# Patient Record
Sex: Female | Born: 1980 | Race: White | Hispanic: No | Marital: Married
Health system: Midwestern US, Community
[De-identification: ages and names within clinical notes are randomized; demographics above are authoritative.]

## PROBLEM LIST (undated history)

## (undated) ENCOUNTER — Inpatient Hospital Stay (HOSPITAL_COMMUNITY): Payer: Self-pay

## (undated) DIAGNOSIS — B379 Candidiasis, unspecified: Secondary | ICD-10-CM

## (undated) DIAGNOSIS — T4145XA Adverse effect of unspecified anesthetic, initial encounter: Secondary | ICD-10-CM

## (undated) DIAGNOSIS — Z789 Other specified health status: Secondary | ICD-10-CM

## (undated) DIAGNOSIS — R42 Dizziness and giddiness: Secondary | ICD-10-CM

## (undated) DIAGNOSIS — Z87440 Personal history of urinary (tract) infections: Secondary | ICD-10-CM

## (undated) DIAGNOSIS — R51 Headache: Secondary | ICD-10-CM

## (undated) DIAGNOSIS — K429 Umbilical hernia without obstruction or gangrene: Secondary | ICD-10-CM

## (undated) DIAGNOSIS — K5900 Constipation, unspecified: Secondary | ICD-10-CM

## (undated) DIAGNOSIS — R0602 Shortness of breath: Secondary | ICD-10-CM

## (undated) DIAGNOSIS — T8859XA Other complications of anesthesia, initial encounter: Secondary | ICD-10-CM

## (undated) DIAGNOSIS — K602 Anal fissure, unspecified: Secondary | ICD-10-CM

## (undated) DIAGNOSIS — R040 Epistaxis: Secondary | ICD-10-CM

## (undated) HISTORY — DX: Anal fissure, unspecified: K60.2

## (undated) HISTORY — PX: APPENDECTOMY: SHX54

## (undated) HISTORY — PX: HERNIA REPAIR: SHX51

---

## 2009-08-16 HISTORY — PX: ANAL FISSURE REPAIR: SHX2312

## 2011-11-26 ENCOUNTER — Inpatient Hospital Stay (HOSPITAL_COMMUNITY)
Admission: AD | Admit: 2011-11-26 | Discharge: 2011-11-26 | Disposition: A | Discharge status: Home / Self Care | Payer: Medicaid Other | Source: Ambulatory Visit | Attending: Obstetrics and Gynecology | Admitting: Obstetrics and Gynecology

## 2011-11-26 ENCOUNTER — Encounter (HOSPITAL_COMMUNITY): Payer: Self-pay | Admitting: *Deleted

## 2011-11-26 DIAGNOSIS — O99891 Other specified diseases and conditions complicating pregnancy: Secondary | ICD-10-CM | POA: Insufficient documentation

## 2011-11-26 DIAGNOSIS — R1013 Epigastric pain: Secondary | ICD-10-CM | POA: Insufficient documentation

## 2011-11-26 DIAGNOSIS — O26899 Other specified pregnancy related conditions, unspecified trimester: Secondary | ICD-10-CM

## 2011-11-26 DIAGNOSIS — R12 Heartburn: Secondary | ICD-10-CM | POA: Insufficient documentation

## 2011-11-26 MED ORDER — GI COCKTAIL ~~LOC~~
30.0000 mL | Freq: Once | ORAL | Status: AC
  Administered 2011-11-26: 30 mL via ORAL
  Filled 2011-11-26: qty 30

## 2011-11-26 MED ORDER — RANITIDINE HCL 150 MG PO TABS: 150.0000 mg | ORAL_TABLET | Freq: Two times a day (BID) | ORAL | Status: DC

## 2011-11-26 NOTE — Discharge Instructions (Signed)
Avoid any fried foods or spicy foods. You can chew Tums anytime for this pain. Get your prescription filled and take as directed

## 2011-11-26 NOTE — MAU Provider Note (Signed)
  History     CSN: [id number]  Arrival date and time: 11/26/11 4098   First Provider Initiated Contact with Patient 11/26/11 0159      No chief complaint on file.  HPI Heidi [name] 30 y.o. [redacted]w[redacted]d [facility]  Has had epgastric pain x 3 days and it is getting worse.  Noticing increase in size in upper abdomen.  Has had trouble with constipation and takes oral medicine from [place] to help her BMs be regular.  Had BM today.  Denies any lower abdominal pain.  Denies any vaginal bleeding.  OB History    Grav Para Term Preterm Abortions TAB SAB Ect Mult Living   2         1      No past medical history on file.  No past surgical history on file.  No family history on file.  History  Substance Use Topics  . Smoking status: Not on file  . Smokeless tobacco: Not on file  . Alcohol Use: Not on file    Allergies: No Known Allergies  Prescriptions prior to admission  Medication Sig Dispense Refill  . PRESCRIPTION MEDICATION Take by mouth daily. Pt takes Osmosine for constipation gets it from [place].        ROS Physical Exam   Blood pressure 107/61, pulse 69, temperature 99.1 F (37.3 C), temperature source Oral, resp. rate 18, height 5\' 3"  (1.6 m), weight 185 lb 2 oz (83.972 kg).  Physical Exam  Nursing note and vitals reviewed. Constitutional: She is oriented to person, place, and time. She appears well-developed and well-nourished.  HENT:  Head: Normocephalic.  Eyes: EOM are normal.  Neck: Neck supple.  GI: Soft. There is tenderness. There is no rebound and no guarding.       Tender in upper midline just below sternum.  No lower abdominal tenderness or cramping  Musculoskeletal: Normal range of motion.  Neurological: She is alert and oriented to person, place, and time.  Skin: Skin is warm and dry.  Psychiatric: She has a normal mood and affect.    MAU Course  Procedures  MDM Will give GI  cocktail to see if it helps pain. 0230 - Symptoms improved after GI cocktail.  Assessment and Plan  Heartburn in pregnancy  Plan rx zantac 150 mg PO bid  (#60) no refills Avoid any fried foods or spicy foods. You can chew Tums anytime for this pain. Get your prescription filled and take as directed  [name] 11/26/2011, 2:04 AM

## 2011-11-26 NOTE — MAU Note (Signed)
WENT TO [facility]  ON 3-26-  POSTIVE PREG TEST.   AND EDC.  NEXT APPOINTMENT 4-22.

## 2011-11-26 NOTE — MAU Note (Signed)
PT SAYS STARTED HAVING UPPER ABD PAIN  2 WEEKS AGO- GETTING WORSE. NO VOMITING.  NO LOWER ABD PAIN.  NO PAIN TO VOID.   PT FROM  [place]-  IS TAKING MED FOR CONSTIPATION- WITH RELIEF-- OSMOSINE.

## 2011-12-01 NOTE — MAU Provider Note (Signed)
Agree with above note.  Heidi [name] 12/01/2011 9:40 AM   

## 2011-12-08 ENCOUNTER — Other Ambulatory Visit (HOSPITAL_COMMUNITY): Payer: Self-pay | Admitting: Nurse Practitioner

## 2011-12-08 DIAGNOSIS — Z3689 Encounter for other specified antenatal screening: Secondary | ICD-10-CM

## 2011-12-20 ENCOUNTER — Encounter (HOSPITAL_COMMUNITY): Payer: Self-pay | Admitting: Emergency Medicine

## 2011-12-20 ENCOUNTER — Emergency Department (HOSPITAL_COMMUNITY)
Admission: EM | Admit: 2011-12-20 | Discharge: 2011-12-20 | Disposition: A | Discharge status: Home / Self Care | Payer: Medicaid Other | Source: Home / Self Care | Attending: Emergency Medicine | Admitting: Emergency Medicine

## 2011-12-20 DIAGNOSIS — R0600 Dyspnea, unspecified: Secondary | ICD-10-CM

## 2011-12-20 DIAGNOSIS — R1013 Epigastric pain: Secondary | ICD-10-CM

## 2011-12-20 DIAGNOSIS — R0609 Other forms of dyspnea: Secondary | ICD-10-CM

## 2011-12-20 NOTE — Discharge Instructions (Signed)
  We discuss several things. Your abdominal exam was not concerning at this point in your lung exam was normal we discuss what symptoms should warrant further evaluation. Followup with your doctor Monday as scheduled. Baby sounds good. As discussed try to walk several times a day and try to eat frequently in small amounts. If any worsening pain new symptoms worse discomfort should [facility]     Abdominal Pain During Pregnancy Belly (abdominal) pain is common during pregnancy. Most of the time, it is not a serious problem. Other times, it can be a sign that something is wrong with the pregnancy. Always tell your doctor if you have belly pain. HOME CARE For mild pain:  Do not have sex (intercourse) or put anything in your vagina until you feel better.   Rest until your pain stops. If your pain lasts longer than 1 hour, call your doctor.   Drink clear fluids if you feel sick to your stomach (nauseous).   Do not eat solid food until you feel better.   Only take medicine as told by your doctor.   Keep all doctor visits as told.  GET HELP RIGHT AWAY IF:   You are bleeding, leaking fluid, or pieces of tissue come out of your vagina.   You have more pain or cramping.   You keep throwing up (vomiting).   You have pain when you pee (urinate) or have blood in your pee.   You have a fever.   You do not feel your baby moving as much.   You feel very weak or feel like passing out.   You have trouble breathing, with or without belly pain.   You have a very bad headache and belly pain.   You have fluid leaking from your vagina and belly pain.   You keep having watery poop (diarrhea).   Your belly pain does not go away after resting, or the pain gets worse.  MAKE SURE YOU:   Understand these instructions.   Will watch your condition.   Will get help right away if you are not doing well or get worse.  Document Released: 07/16/2009 Document Revised: 07/17/2011 Document  Reviewed: 02/21/2011 [facility] Patient Information 2012 [place], [place].

## 2011-12-20 NOTE — ED Provider Notes (Signed)
History     CSN: [id number]  Arrival date & time 12/20/11  1517   First MD Initiated Contact with Patient 12/20/11 1522      Chief Complaint  Patient presents with  . Shortness of Breath    (Consider location/radiation/quality/duration/timing/severity/associated sxs/prior treatment) HPI Comments: Patient has been complaining for about a month she has been feeling short of breath and having pains in her stomach especially after she eats. Also describes that she has not been having regular bowel movements. At times she feels bloated. No fevers, no burning or pressure with urination. Patient denies any cough, upper congestion or fevers. She was seen at [facility] hospital the same symptoms as prescribed a medication for her stomach.  Patient denies any vomiting or diarrhea, no headaches.  She is accompanied today by her husband and her first child. They questioned that during first pregnancy she was always expressing pelvic pain not in her upper abdomen as it is now?  Patient is a 31 y.o. female presenting with shortness of breath. The history is provided by the patient.  Shortness of Breath  The current episode started today. The problem occurs continuously. The problem has been unchanged. The problem is moderate. Associated symptoms include shortness of breath. Pertinent negatives include no chest pain, no chest pressure, no orthopnea, no fever, no stridor, no cough and no wheezing.    History reviewed. No pertinent past medical history.  History reviewed. No pertinent past surgical history.  History reviewed. No pertinent family history.  History  Substance Use Topics  . Smoking status: Not on file  . Smokeless tobacco: Not on file  . Alcohol Use: Not on file    OB History    Grav Para Term Preterm Abortions TAB SAB Ect Mult Living   2         1      Review of Systems  Constitutional: Negative for fever, chills, diaphoresis and activity change.  Respiratory: Positive for  shortness of breath. Negative for cough, choking, chest tightness, wheezing and stridor.   Cardiovascular: Negative for chest pain and orthopnea.  Gastrointestinal: Positive for abdominal pain. Negative for vomiting, diarrhea, abdominal distention and rectal pain.  Genitourinary: Negative for dysuria, urgency, vaginal bleeding and pelvic pain.  Skin: Negative for rash.  Neurological: Negative for dizziness, weakness and headaches.    Allergies  Review of patient's allergies indicates no known allergies.  Home Medications   Current Outpatient Rx  Name Route Sig Dispense Refill  . PRESCRIPTION MEDICATION Oral Take by mouth daily. Pt takes Osmosine for constipation gets it from [place].    Marland Kitchen RANITIDINE HCL 150 MG PO TABS Oral Take 1 tablet (150 mg total) by mouth 2 (two) times daily. 60 tablet 1    BP 111/60  Pulse 97  Temp(Src) 97.8 F (36.6 C) (Oral)  Resp 22  SpO2 100%  Physical Exam  Nursing note and vitals reviewed. Constitutional: She appears well-developed and well-nourished. No distress.  HENT:  Head: Normocephalic.  Mouth/Throat: No oropharyngeal exudate.  Eyes: Conjunctivae are normal. No scleral icterus.  Neck: Neck supple.  Pulmonary/Chest: Effort normal and breath sounds normal. No respiratory distress. She has no wheezes.  Abdominal: Soft. She exhibits no distension and no mass. There is no hepatosplenomegaly. There is tenderness in the epigastric area. There is no rigidity, no rebound and no guarding.    Neurological: She is alert.  Skin: No rash noted.    ED Course  Procedures (including critical care time)  Labs Reviewed - No data to display No results found.   1. Epigastric discomfort   2. Dyspnea       MDM  Patient with symptomatology as feeling dyspneic and with epigastric discomfort. Mild epigastric tenderness was elicited during exam. Patient looks comfortable in no respiratory distress. You Fahrenheit as expected for a four-month  pregnancy. Fetal heart rate averaging about 140        Jimmie Molly, MD 12/20/11 1637

## 2011-12-20 NOTE — ED Notes (Signed)
Registration associate JB called nurse station re: pregnant pt c/o shortness of breath and epigastric pressure. Vitals were taken and are WNL at this time.  Pt speaks in full sentences; while mildly tachypnic she does not appear to be in respiratory distress.  Pt directed to return to waiting area to await triage by RN.

## 2011-12-20 NOTE — ED Notes (Signed)
Pt reports having dyspnea after eating for about a month now. She also has bad constipation. No dyspnea at other times.

## 2011-12-22 ENCOUNTER — Ambulatory Visit (HOSPITAL_COMMUNITY)
Admission: RE | Admit: 2011-12-22 | Discharge: 2011-12-22 | Disposition: A | Discharge status: Home / Self Care | Payer: Medicaid Other | Source: Ambulatory Visit | Attending: Nurse Practitioner | Admitting: Nurse Practitioner

## 2011-12-22 ENCOUNTER — Ambulatory Visit (HOSPITAL_COMMUNITY): Payer: Medicaid Other

## 2011-12-22 DIAGNOSIS — O358XX0 Maternal care for other (suspected) fetal abnormality and damage, not applicable or unspecified: Secondary | ICD-10-CM | POA: Insufficient documentation

## 2011-12-22 DIAGNOSIS — Z363 Encounter for antenatal screening for malformations: Secondary | ICD-10-CM | POA: Insufficient documentation

## 2011-12-22 DIAGNOSIS — Z3689 Encounter for other specified antenatal screening: Secondary | ICD-10-CM

## 2011-12-22 DIAGNOSIS — Z1389 Encounter for screening for other disorder: Secondary | ICD-10-CM | POA: Insufficient documentation

## 2012-01-26 ENCOUNTER — Other Ambulatory Visit (HOSPITAL_COMMUNITY): Payer: Self-pay | Admitting: Nurse Practitioner

## 2012-01-26 DIAGNOSIS — O26849 Uterine size-date discrepancy, unspecified trimester: Secondary | ICD-10-CM

## 2012-02-02 ENCOUNTER — Ambulatory Visit (HOSPITAL_COMMUNITY): Payer: Medicaid Other

## 2012-02-05 ENCOUNTER — Ambulatory Visit (HOSPITAL_COMMUNITY)
Admission: RE | Admit: 2012-02-05 | Discharge: 2012-02-05 | Disposition: A | Discharge status: Home / Self Care | Payer: Medicaid Other | Source: Ambulatory Visit | Attending: Nurse Practitioner | Admitting: Nurse Practitioner

## 2012-02-05 DIAGNOSIS — Z3689 Encounter for other specified antenatal screening: Secondary | ICD-10-CM | POA: Insufficient documentation

## 2012-02-05 DIAGNOSIS — O26849 Uterine size-date discrepancy, unspecified trimester: Secondary | ICD-10-CM | POA: Insufficient documentation

## 2012-08-16 ENCOUNTER — Encounter (HOSPITAL_COMMUNITY): Payer: Self-pay | Admitting: *Deleted

## 2012-08-16 ENCOUNTER — Inpatient Hospital Stay (HOSPITAL_COMMUNITY)
Admission: AD | Admit: 2012-08-16 | Discharge: 2012-08-16 | Disposition: A | Discharge status: Home / Self Care | Payer: Self-pay | Source: Ambulatory Visit | Attending: Obstetrics & Gynecology | Admitting: Obstetrics & Gynecology

## 2012-08-16 DIAGNOSIS — M6200 Separation of muscle (nontraumatic), unspecified site: Secondary | ICD-10-CM

## 2012-08-16 DIAGNOSIS — R109 Unspecified abdominal pain: Secondary | ICD-10-CM | POA: Insufficient documentation

## 2012-08-16 DIAGNOSIS — M6208 Separation of muscle (nontraumatic), other site: Secondary | ICD-10-CM

## 2012-08-16 HISTORY — DX: Other specified health status: Z78.9

## 2012-08-16 LAB — URINALYSIS, ROUTINE W REFLEX MICROSCOPIC
Bilirubin Urine: NEGATIVE
Ketones, ur: 15 mg/dL — AB
Nitrite: NEGATIVE
pH: 7 (ref 5.0–8.0)

## 2012-08-16 NOTE — Discharge Instructions (Signed)
Diastasis Recti   Definition: Diastasis recti is a fairly common condition of pregnancy and postpartum in which the right and left halves of Rectus Abdominis muscle spread apart at the body's mid line fascia, the linea alba.  Widening and thinning of the mid line tissue occurs in response the force of the uterus pushing against the abdominal wall, in conjunction with pregnancy hormones that soften connective tissue. A mid line of more than 2 to 2.5 finger-widths, or 2 centimeters, is considered problematic. Diastasis recti can occur anytime in the last half of pregnancy but is most commonly seen after pregnancy when the abdominal wall is lax and the thinner mid line tissue no longer provides adequate support for the torso and internal organs.  A small amount of widening of the mid line happens in all pregnancies and is normal. Diastasis recti occurs in about 30% of all pregnancies. Some postpartum women's mid lines close to less than 2 finger-widths spontaneously, but for many, the tissue remains too wide, causing problems.  Diastasis recti can also be seen in infants and adults with excessive abdominal visceral fat.    Diastatsis recti reduces the integrity and functional strength of the abdominal wall and can aggravate lower back pain and pelvic instability. Separation in a previous pregnancy significantly increases the probability, and severity, of the condition in subsequent pregnancies. Women expecting more than one baby, petite women, those with a pronounced sway back, or with poor abdominal muscle tone are at greatest risk. Genetics also plays a big role. For some women, it simply how their bodies respond to pregnancy.  Unfortunately, flurries of misconception swirl around diastasis recti and abdominal exercise during and after pregnancy in general. You're likely to encounter a broad range of contradictory opinions and advice about how to recondition your abdominal wall and how to restore the  midline after childbirth. Some of these assertions can cause unnecessary alarm, while another common piece of advice-do a lot of "crunches"-can actually worsen diastasis recti/abdominal separation.  COMMON MYTHS about Diastasis Recti/Abdominal Separation and Postpartum Abdominal Reconditioning:  Diastasis recti/abdominal separation causes permanent damage to your abdomen.  Diastasis recti/abdominal separation requires surgical repair.  Diastasis recti/abdominal separation causes permanent bulging of the abdomen, i.e., "mummy-tummy."  Diastasis recti causes pain.  The abdominal muscles will always be weaker after childbirth.  All women should wait for at least six weeks after delivery before beginning any abdominal exercises or postnatal reconditioning program.  None of these statements are true!  Diastasis Recti/Abdominal Separation Test This simple self-test will help you determine if you have diastasis recti.  1. Lie on your back with your knees bent, and the soles of your feet on the floor.  2. Place one hand behind your head, and the other hand on your abdomen, with your fingertips across your midline-parallel with your waistline- at the level of your belly button.  3. With your abdominal wall relaxed, gently press your fingertips into your abdomen.  4. Roll your upper body off the floor into a "crunch," making sure that your ribcage moves closer to your pelvis.  5. Move your fingertips back and forth across your midline, feeling for the right and left sides of your rectus abdominis muscle. Test for separation at, above, and below your belly button.  Use Correct Form for an Accurate Assessment Make sure that you don't simply pull your head off the mat-a common mistake. To effectively contract your abs, you need to move your ribcage closer to you pelvis. If you don't  adequately activate your abdominal wall, you might assume that you have abdominal separation. But for most, as the rib cage moves  closer to the pelvis and the contraction deepens, the width of the gap at your midline will decrease.  Don't panic if you feel a "hole" in your belly in the first few postpartum weeks. Everyone's connective tissue at the midline is lax after childbirth. As you recover, your midline will slowly regain its former density and elasticity, and the "hole" will become shallower, and if you do the right exercises, more narrow too.  Signs of Diastasis Recti/Abdominal Separation A gap of more than 2 1/2 finger-widths when the rectus abdominis is fully contracted.  The gap does not shrink as you contract your abdominal wall.  You can see a small mound protruding along the length of you midline.  NOTE: If at any time you see a round, hard, or painful bulge protruding from your belly button area, or along your mid line, consult with your OBGYN.  Special Precautions for Women with Diastasis Recti/Abdominal Separation Avoid all activities that place stress on the midline, that stretch or overly expand the abdominal wall through everyday activities, exercise, or breathing techniques.  Some Types of Movement to Avoid  Movements where the upper body twists and the arm on that side extends away from the body, such as "triangle pose."  Exercises that require lying backward over a large exercise ball.  Yoga postures that stretch the abs, such as "cow pose," "up-dog," all backbends, and "belly breathing."  Abdominal exercises that flex the upper spine off the floor or against the force of gravity such as: as crunches, oblique curls, "bicycles," roll ups/roll downs, etc.  Pilates mat and reformer exercises that utilize the "head float" position, upper body flexion, or double leg extension.  Any exercise that causes your abdominal wall to bulge out upon exertion.  Lifting and carrying very heavy objects.  Quadruped exercises without adequate abdominal support.  Intense coughing without abdominal support.  During  pregnancy or after childbirth, if you develop a cough from allergies or a respiratory illness, such as a cold or flu, place your hands across your belly and manually splint your abdomen together during coughing episodes. This will provide needed additional support, and help to prevent separation of your midline.  To protect your mid line during pregnancy, always use the "log roll" maneuver when rising from the floor or out of bed. Log roll: with your torso and head aligned and in one piece, roll over onto your side, then use your arms to help push yourself up to a sitting position.   Exercises to Prevent or Lessen the Severity of Diastasis Recti/Abdominal Separation You can do a lot to help prevent or lessen the severity of diastasis recti by strengthening your deepest abdominal muscle, your Transverse Abdominis, or TvA. The TvA is the body's internal "girdle" and when contracted, compresses the abdominal wall.  All pregnant women should perform basic TvA exercises throughout their pregnancies to help prevent diastasis recti as well as back pain and pelvic instability. As an added bonus, because the TvA is our body's major expulsion muscle, building and maintaining strength here greatly aids in the pushing phase of labor.    Diastasis Recti/Abdominal Separation Recovery and Rehabilitation Most postnatal fitness programs feature standard abdominal exercises like crunches and curls, and advise women with diastasis recti to manually splint their midlines together with either the hands or a towel wrapped around the belly while performing these exercises.  However, reliance on external forces only will not adequately close the gap in your midline.  Additionally, prenatal and postpartum exercises that use lower spine flexion rather than upper spine flexion provide many more benefits and should be used whenever possible. Exercises that flex the upper spine should be done sparingly, if at all during and after  pregnancy.  External splinting (either with the hands, a towel, or belly binder) of the rectus abdominis without first having established adequate strength and functional control in your TvA will not repair diastasis recti. External splinting performs the function that your TvA should be doing, therefore the muscle does not engage and stays weak with poor functioning. When the TvA lacks adequate strength and functional control, then the abdominal wall bulges out upon exertion, which stretches the midline and can make abdominal separation worse. (Please see Abdominal Reconditioning After Pregnancy for more information on this topic.)  After pregnancy, abdominal splinting with the hands or other devices is an acceptable technique only after the TvA has been thoroughly reconditioned.  Another Common but Ineffective Postpartum Exercise: "Mini-Crunches" Other postpartum exercise programs suggest that women with diastasis recti start abdominal reconditioning with "mini-crunches," or lifting only the head, while applying manual compression with the hands across the abdomen. Isolation of the head exerts a small amount of work on the external abdominal muscles, but only in some individuals. Women with good neuromuscular coordination will, for the most part, engage the muscles that flex the neck in this movement, not their abs.  In addition, lifting the head off the floor as a preamble to exercises like crunches teaches poor technique. Ideally, in all abdominal exercises that flex the upper spine, movement should be initiated in the thorax, which will pull the rib cage closer to the pelvis. The head and shoulders should stay fairly relaxed, and basically "go along for the ride." In Pilate's exercises, this is aptly called the "head-float position." (For more information see Abdominal Reconditioning After Pregnancy)  Signs of Midline Recovery Abdominal separation resolves when either your muscles have pulled back  together to less than two finger widths, or when you can feel that your midline has become strong and elastic, at about six to nine months postpartum. At this point, you will no longer feel a hole in your abdomen. Once your connective tissue has regained its former density and elasticity, you are no longer at risk for hernia or other associated problems.

## 2012-08-16 NOTE — MAU Provider Note (Signed)
Chief Complaint: Abdominal Pain   First Provider Initiated Contact with Patient 08/16/12 2030     SUBJECTIVE HPI: Heidi [name] is a 32 y.o. G2P2002 post LTCS on 05/16/12 in [place] who presents to maternity admissions reporting upper abdominal pain when she lifts her baby or uses her abdominal muscles.  The pain is not affected by eating and she is having regular bowel movements and no urinary difficulty.  She denies vaginal bleeding, vaginal itching/burning, urinary symptoms, h/a, dizziness, n/v, or fever/chills.     Past Medical History  Diagnosis Date  . No pertinent past medical history    Past Surgical History  Procedure Date  . Cesarean section   . Anal fissure repair    History   Social History  . Marital Status: Married    Spouse Name: N/A    Number of Children: N/A  . Years of Education: N/A   Occupational History  . Not on file.   Social History Main Topics  . Smoking status: Never Smoker   . Smokeless tobacco: Not on file  . Alcohol Use: No  . Drug Use: No  . Sexually Active: Yes    Birth Control/ Protection: Pill   Other Topics Concern  . Not on file   Social History Narrative  . No narrative on file   No current facility-administered medications on file prior to encounter.   No current outpatient prescriptions on file prior to encounter.   No Known Allergies  ROS: Pertinent items in HPI  OBJECTIVE Blood pressure 105/83, pulse 74, temperature 97.9 F (36.6 C), temperature source Oral, height 5\' 2"  (1.575 m), weight 86.546 kg (190 lb 12.8 oz), last menstrual period 06/25/2012, currently breastfeeding. GENERAL: Well-developed, well-nourished female in no acute distress.  HEENT: Normocephalic HEART: normal rate RESP: normal effort ABDOMEN: Soft, non-tender, no hernia, no mass palpable, large diastasis recti (4-5cm) with large bulge when pt sits up/performs crunch EXTREMITIES: Nontender, no edema NEURO: Alert and oriented SPECULUM EXAM:  Deferred  LAB RESULTS Results for orders placed during the hospital encounter of 08/16/12 (from the past 24 hour(s))  URINALYSIS, ROUTINE W REFLEX MICROSCOPIC     Status: Abnormal   Collection Time   08/16/12  7:35 PM      Component Value Range   Color, Urine YELLOW  YELLOW   APPearance CLEAR  CLEAR   Specific Gravity, Urine 1.010  1.005 - 1.030   pH 7.0  5.0 - 8.0   Glucose, UA NEGATIVE  NEGATIVE mg/dL   Hgb urine dipstick NEGATIVE  NEGATIVE   Bilirubin Urine NEGATIVE  NEGATIVE   Ketones, ur 15 (*) NEGATIVE mg/dL   Protein, ur NEGATIVE  NEGATIVE mg/dL   Urobilinogen, UA 0.2  0.0 - 1.0 mg/dL   Nitrite NEGATIVE  NEGATIVE   Leukocytes, UA NEGATIVE  NEGATIVE  POCT PREGNANCY, URINE     Status: Normal   Collection Time   08/16/12  8:03 PM      Component Value Range   Preg Test, Ur NEGATIVE  NEGATIVE    ASSESSMENT 1. Diastasis recti     PLAN Discharge home Ambulatory referral for PT Suggested exercises given, encouraged good body mechanics Return to MAU as needed       Follow-up Information    Please follow up.   Contact information:   [facility] [address], [address] [place], [place] [address]    Main: [phone]  Fax: [phone]  Sharen Counter Certified Nurse-Midwife 08/16/2012  9:13 PM

## 2012-08-16 NOTE — MAU Note (Signed)
Pt post C/S 05/15/2012, in [place].  Pt presents tonight feels like she has been bloated since surgery and heavy "like a bowling ball is in her belly."  Denies bleeding or discharge.  LMP 06/25/2012, bleed for one day in December.  Taking birth control pills from [place].

## 2012-10-16 ENCOUNTER — Emergency Department (HOSPITAL_COMMUNITY): Payer: Self-pay

## 2012-10-16 ENCOUNTER — Emergency Department (HOSPITAL_COMMUNITY)
Admission: EM | Admit: 2012-10-16 | Discharge: 2012-10-16 | Disposition: A | Discharge status: Home / Self Care | Payer: Self-pay | Attending: Emergency Medicine | Admitting: Emergency Medicine

## 2012-10-16 ENCOUNTER — Encounter (HOSPITAL_COMMUNITY): Payer: Self-pay

## 2012-10-16 DIAGNOSIS — Z3202 Encounter for pregnancy test, result negative: Secondary | ICD-10-CM | POA: Insufficient documentation

## 2012-10-16 DIAGNOSIS — K5900 Constipation, unspecified: Secondary | ICD-10-CM | POA: Insufficient documentation

## 2012-10-16 DIAGNOSIS — K429 Umbilical hernia without obstruction or gangrene: Secondary | ICD-10-CM | POA: Insufficient documentation

## 2012-10-16 DIAGNOSIS — R109 Unspecified abdominal pain: Secondary | ICD-10-CM | POA: Insufficient documentation

## 2012-10-16 LAB — COMPREHENSIVE METABOLIC PANEL
ALT: 15 U/L (ref 0–35)
AST: 17 U/L (ref 0–37)
CO2: 25 mEq/L (ref 19–32)
Chloride: 106 mEq/L (ref 96–112)
GFR calc non Af Amer: >90 mL/min (ref >90)
Sodium: 142 mEq/L (ref 135–145)
Total Bilirubin: 0.3 mg/dL (ref 0.3–1.2)

## 2012-10-16 LAB — CBC WITH DIFFERENTIAL/PLATELET
Basophils Absolute: 0.1 10*3/uL (ref 0.0–0.1)
Lymphocytes Relative: 39 % (ref 12–46)
Neutro Abs: 3.7 10*3/uL (ref 1.7–7.7)
Platelets: 196 10*3/uL (ref 150–400)
RDW: 14 % (ref 11.5–15.5)
WBC: 7.1 10*3/uL (ref 4.0–10.5)

## 2012-10-16 LAB — URINALYSIS, MICROSCOPIC ONLY
Glucose, UA: NEGATIVE mg/dL
Hgb urine dipstick: NEGATIVE
Protein, ur: NEGATIVE mg/dL
Specific Gravity, Urine: 1.016 (ref 1.005–1.030)

## 2012-10-16 MED ORDER — IOHEXOL 300 MG/ML  SOLN
100.0000 mL | Freq: Once | INTRAMUSCULAR | Status: AC | PRN
  Administered 2012-10-16: 100 mL via INTRAVENOUS

## 2012-10-16 MED ORDER — DOCUSATE SODIUM 100 MG PO CAPS: 100.0000 mg | ORAL_CAPSULE | Freq: Two times a day (BID) | ORAL | Status: DC

## 2012-10-16 MED ORDER — IOHEXOL 300 MG/ML  SOLN
50.0000 mL | Freq: Once | INTRAMUSCULAR | Status: AC | PRN
  Administered 2012-10-16: 50 mL via ORAL

## 2012-10-16 MED ORDER — SODIUM CHLORIDE 0.9 % IV BOLUS (SEPSIS)
1000.0000 mL | Freq: Once | INTRAVENOUS | Status: AC
  Administered 2012-10-16: 1000 mL via INTRAVENOUS

## 2012-10-16 MED ORDER — OXYCODONE-ACETAMINOPHEN 5-325 MG PO TABS: 2.0000 | ORAL_TABLET | ORAL | Status: DC | PRN

## 2012-10-16 NOTE — ED Notes (Addendum)
Pt has hernia in epigastric region pt has had a c-section 5 mos ago. Pt states eating and holding her baby makes the pain worse. Pt states she is in 8/10 pain. Pt speaks limited english. Husband at bedside to translate.

## 2012-10-16 NOTE — ED Notes (Signed)
Discharge instructions reviewed. Pt verbalized understanding.  

## 2012-10-16 NOTE — ED Notes (Signed)
CT notified pt finished with first cup of contrast.  

## 2012-10-16 NOTE — ED Notes (Signed)
Pt transported to CT ?

## 2012-10-16 NOTE — ED Provider Notes (Signed)
History     CSN: [id number]  Arrival date & time 10/16/12  1201   First MD Initiated Contact with Patient 10/16/12 1504      Chief Complaint  Patient presents with  . Hernia  . Abdominal Pain    (Consider location/radiation/quality/duration/timing/severity/associated sxs/prior treatment) HPI Comments: Patient presents with right-sided abdominal pain. She states this has been going on for about the last 4-5 months. She feels that it is associated with a prior hernia on the right side. She states that she developed a hernia after her pregnancy. She's had 2 past C-sections. Her last C-section was 5 months ago and her pain has intensified over the last 5 months since his prior surgery. She denies any nausea or vomiting. She denies any fevers or chills. She states the pain is worse when she strains or when her abdomen is full after she eats. She [place] country [place] about the last 3-4 months. She has not seen a doctor about this hernia. She does have a history of constipation and her last bowel movement was 3 days ago. However she does see that she's passing gas. She denies any urinary symptoms.she does state that she feels a knot in the right side of her abdomen and previously when she laid flat she could taken not to go way. However she says that the knot is been protruding for the last 5 months. She states the pain has been worse over the last 3 days.  Patient is a 32 y.o. female presenting with abdominal pain.  Abdominal Pain Associated symptoms: constipation   Associated symptoms: no chest pain, no chills, no cough, no diarrhea, no fatigue, no fever, no hematuria, no nausea, no shortness of breath and no vomiting     Past Medical History  Diagnosis Date  . No pertinent past medical history     Past Surgical History  Procedure Laterality Date  . Cesarean section    . Anal fissure repair      No family history on  file.  History  Substance Use Topics  . Smoking status: Never Smoker   . Smokeless tobacco: Not on file  . Alcohol Use: No    OB History   Grav Para Term Preterm Abortions TAB SAB Ect Mult Living   2 2 2       2       Review of Systems  Constitutional: Negative for fever, chills, diaphoresis and fatigue.  HENT: Negative for congestion, rhinorrhea and sneezing.   Eyes: Negative.   Respiratory: Negative for cough, chest tightness and shortness of breath.   Cardiovascular: Negative for chest pain and leg swelling.  Gastrointestinal: Positive for abdominal pain and constipation. Negative for nausea, vomiting, diarrhea and blood in stool.  Genitourinary: Negative for frequency, hematuria, flank pain and difficulty urinating.  Musculoskeletal: Negative for back pain and arthralgias.  Skin: Negative for rash.  Neurological: Negative for dizziness, speech difficulty, weakness, numbness and headaches.    Allergies  Review of patient's allergies indicates no known allergies.  Home Medications   Current Outpatient Rx  Name  Route  Sig  Dispense  Refill  . docusate sodium (COLACE) 100 MG capsule   Oral   Take 1 capsule (100 mg total) by mouth every 12 (twelve) hours.   60 capsule   0   . oxyCODONE-acetaminophen (PERCOCET) 5-325 MG per tablet   Oral   Take 2 tablets by mouth every 4 (  four) hours as needed for pain.   20 tablet   0     BP 123/62  Temp(Src) 97.7 F (36.5 C) (Oral)  Resp 17  SpO2 97%  Breastfeeding? Yes  Physical Exam  Constitutional: She is oriented to person, place, and time. She appears well-developed and well-nourished.  HENT:  Head: Normocephalic and atraumatic.  Eyes: Pupils are equal, round, and reactive to light.  Neck: Normal range of motion. Neck supple.  Cardiovascular: Normal rate, regular rhythm and normal heart sounds.   Pulmonary/Chest: Effort normal and breath sounds normal. No respiratory distress. She has no wheezes. She has no rales.  She exhibits no tenderness.  Abdominal: Soft. Bowel sounds are normal. There is tenderness (Positive moderate tenderness to the right midabdomen. I am unable to palpate a specific hernia.). There is no rebound and no guarding.  Musculoskeletal: Normal range of motion. She exhibits no edema.  Lymphadenopathy:    She has no cervical adenopathy.  Neurological: She is alert and oriented to person, place, and time.  Skin: Skin is warm and dry. No rash noted.  Psychiatric: She has a normal mood and affect.    ED Course  Procedures (including critical care time)  Results for orders placed during the hospital encounter of 10/16/12  URINALYSIS, MICROSCOPIC ONLY      Result Value Range   Color, Urine YELLOW  YELLOW   APPearance CLEAR  CLEAR   Specific Gravity, Urine 1.016  1.005 - 1.030   pH 6.5  5.0 - 8.0   Glucose, UA NEGATIVE  NEGATIVE mg/dL   Hgb urine dipstick NEGATIVE  NEGATIVE   Bilirubin Urine NEGATIVE  NEGATIVE   Ketones, ur NEGATIVE  NEGATIVE mg/dL   Protein, ur NEGATIVE  NEGATIVE mg/dL   Urobilinogen, UA 0.2  0.0 - 1.0 mg/dL   Nitrite NEGATIVE  NEGATIVE   Leukocytes, UA NEGATIVE  NEGATIVE   WBC, UA 0-2  <3 WBC/hpf   RBC / HPF 0-2  <3 RBC/hpf   Bacteria, UA RARE  RARE   Squamous Epithelial / LPF FEW (*) RARE  CBC WITH DIFFERENTIAL      Result Value Range   WBC 7.1  4.0 - 10.5 K/uL   RBC 5.24 (*) 3.87 - 5.11 MIL/uL   Hemoglobin 14.6  12.0 - 15.0 g/dL   HCT 40.9  81.1 - 91.4 %   MCV 82.8  78.0 - 100.0 fL   MCH 27.9  26.0 - 34.0 pg   MCHC 33.6  30.0 - 36.0 g/dL   RDW 78.2  95.6 - 21.3 %   Platelets 196  150 - 400 K/uL   Neutrophils Relative 52  43 - 77 %   Neutro Abs 3.7  1.7 - 7.7 K/uL   Lymphocytes Relative 39  12 - 46 %   Lymphs Abs 2.8  0.7 - 4.0 K/uL   Monocytes Relative 7  3 - 12 %   Monocytes Absolute 0.5  0.1 - 1.0 K/uL   Eosinophils Relative 1  0 - 5 %   Eosinophils Absolute 0.1  0.0 - 0.7 K/uL   Basophils Relative 1  0 - 1 %   Basophils Absolute 0.1  0.0 -  0.1 K/uL  COMPREHENSIVE METABOLIC PANEL      Result Value Range   Sodium 142  135 - 145 mEq/L   Potassium 3.9  3.5 - 5.1 mEq/L   Chloride 106  96 - 112 mEq/L   CO2 25  19 - 32 mEq/L  Glucose, Bld 113 (*) 70 - 99 mg/dL   BUN 10  6 - 23 mg/dL   Creatinine, Ser 3.53 (*) 0.50 - 1.10 mg/dL   Calcium 9.6  8.4 - 61.4 mg/dL   Total Protein 7.3  6.0 - 8.3 g/dL   Albumin 3.6  3.5 - 5.2 g/dL   AST 17  0 - 37 U/L   ALT 15  0 - 35 U/L   Alkaline Phosphatase 116  39 - 117 U/L   Total Bilirubin 0.3  0.3 - 1.2 mg/dL   GFR calc non Af Amer >90  >90 mL/min   GFR calc Af Amer >90  >90 mL/min  LIPASE, BLOOD      Result Value Range   Lipase 31  11 - 59 U/L  POCT PREGNANCY, URINE      Result Value Range   Preg Test, Ur NEGATIVE  NEGATIVE   Ct Abdomen Pelvis W Contrast  10/16/2012  *RADIOLOGY REPORT*  Clinical Data: History of hernia and epigastric region.  Right lower quadrant pain.  CT ABDOMEN AND PELVIS WITH CONTRAST  Technique:  Multidetector CT imaging of the abdomen and pelvis was performed following the standard protocol during bolus administration of intravenous contrast.  Contrast: OMNIPAQUE IOHEXOL 300 MG/ML  SOLN  Comparison: None.  Findings: Mild dependent atelectasis is seen in the lung bases. No pleural or pericardial effusion.  The gallbladder, liver, spleen, adrenal glands, pancreas and kidneys are unremarkable.  There is laxity of the anterior abdominal wall with a small hernia at the umbilicus identified. Small and large bowel loops extends into the lax anterior abdominal wall and hernia but there is no evidence of bowel obstruction, incarceration or ischemia.  The appendix is visualized and normal in appearance.  Fairly large stool burden through the colon is noted.  Small bowel is unremarkable.  IUD is in place the uterus. Urinary bladder appears normal.  No focal bony abnormality.  IMPRESSION:  1.  Negative for appendicitis.  No acute abnormality. 2.  Laxity of the anterior abdominal  wall with a small ventral hernia at the umbilicus.  Negative for evidence of bowel obstruction, incarceration or other complicating feature.   Original Report Authenticated By: Holley Dexter, M.D.        1. Umbilical hernia       MDM  Pt with no evidence of incarcerated hernia.  No vomiting.  Will start pain meds, colace, refer to f/u with surgery.  Advised to return if symptoms worsen.        Rolan Bucco, MD 10/16/12 1729

## 2012-10-16 NOTE — ED Notes (Signed)
Pt developed hernia when she was pregnant approximately 5 months ago.  Pt presents today for evaluation of hernia.  Pt also c/o abdominal pain to the RLQ.  Pt has had 2 c-sections.

## 2012-10-20 ENCOUNTER — Ambulatory Visit (INDEPENDENT_AMBULATORY_CARE_PROVIDER_SITE_OTHER): Payer: Self-pay | Admitting: Surgery

## 2012-10-25 ENCOUNTER — Ambulatory Visit (INDEPENDENT_AMBULATORY_CARE_PROVIDER_SITE_OTHER): Payer: Self-pay | Admitting: Surgery

## 2012-10-28 ENCOUNTER — Encounter (HOSPITAL_COMMUNITY): Payer: Self-pay | Admitting: Emergency Medicine

## 2012-10-28 ENCOUNTER — Emergency Department (HOSPITAL_COMMUNITY)
Admission: EM | Admit: 2012-10-28 | Discharge: 2012-10-29 | Disposition: A | Discharge status: Home / Self Care | Payer: Self-pay | Attending: Emergency Medicine | Admitting: Emergency Medicine

## 2012-10-28 DIAGNOSIS — K5900 Constipation, unspecified: Secondary | ICD-10-CM | POA: Insufficient documentation

## 2012-10-28 DIAGNOSIS — Z8719 Personal history of other diseases of the digestive system: Secondary | ICD-10-CM | POA: Insufficient documentation

## 2012-10-28 DIAGNOSIS — Z3202 Encounter for pregnancy test, result negative: Secondary | ICD-10-CM | POA: Insufficient documentation

## 2012-10-28 DIAGNOSIS — K439 Ventral hernia without obstruction or gangrene: Secondary | ICD-10-CM | POA: Insufficient documentation

## 2012-10-28 DIAGNOSIS — Z79899 Other long term (current) drug therapy: Secondary | ICD-10-CM | POA: Insufficient documentation

## 2012-10-28 HISTORY — DX: Umbilical hernia without obstruction or gangrene: K42.9

## 2012-10-28 LAB — COMPREHENSIVE METABOLIC PANEL
Alkaline Phosphatase: 119 U/L — ABNORMAL HIGH (ref 39–117)
BUN: 13 mg/dL (ref 6–23)
CO2: 25 mEq/L (ref 19–32)
Chloride: 106 mEq/L (ref 96–112)
Creatinine, Ser: 0.53 mg/dL (ref 0.50–1.10)
GFR calc Af Amer: >90 mL/min (ref >90)
GFR calc non Af Amer: >90 mL/min (ref >90)
Glucose, Bld: 106 mg/dL — ABNORMAL HIGH (ref 70–99)
Total Bilirubin: 0.2 mg/dL — ABNORMAL LOW (ref 0.3–1.2)

## 2012-10-28 LAB — CBC WITH DIFFERENTIAL/PLATELET
Basophils Relative: 1 % (ref 0–1)
HCT: 42.3 % (ref 36.0–46.0)
Hemoglobin: 14.2 g/dL (ref 12.0–15.0)
Lymphocytes Relative: 44 % (ref 12–46)
Lymphs Abs: 3.3 10*3/uL (ref 0.7–4.0)
MCHC: 33.6 g/dL (ref 30.0–36.0)
Monocytes Absolute: 0.5 10*3/uL (ref 0.1–1.0)
Monocytes Relative: 7 % (ref 3–12)
Neutro Abs: 3.6 10*3/uL (ref 1.7–7.7)
RBC: 5.07 MIL/uL (ref 3.87–5.11)

## 2012-10-28 NOTE — ED Notes (Signed)
PT. REPORTS PERSISTENT MID/RIGHT ABDOMINAL PAIN FOR SEVERAL DAYS , DENIES NAUSEA OR DIAPHORESIS , SEEN HERE LAST 10/16/2012 DIAGNOSED WITH UMBILICAL HERNIA REFERRED TO DR. BYERLY , [facility]  BUT WAS NOT ABLE TO ARRANGE APPOINTMENT DUE TO FINANCIAL CONSTRAINTS. NO FEVER OR CHILLS.

## 2012-10-29 ENCOUNTER — Emergency Department (HOSPITAL_COMMUNITY): Payer: Self-pay

## 2012-10-29 LAB — URINALYSIS, ROUTINE W REFLEX MICROSCOPIC
Bilirubin Urine: NEGATIVE
Glucose, UA: NEGATIVE mg/dL
Ketones, ur: NEGATIVE mg/dL
Leukocytes, UA: NEGATIVE
pH: 5.5 (ref 5.0–8.0)

## 2012-10-29 LAB — POCT PREGNANCY, URINE: Preg Test, Ur: NEGATIVE

## 2012-10-29 MED ORDER — POLYETHYLENE GLYCOL 3350 17 G PO PACK: 17.0000 g | PACK | Freq: Every day | ORAL | Status: DC

## 2012-10-29 NOTE — ED Provider Notes (Signed)
Medical screening examination/treatment/procedure(s) were performed by non-physician practitioner and as supervising physician I was immediately available for consultation/collaboration.  Josie Burleigh M Ronn Smolinsky, MD 10/29/12 0749 

## 2012-10-29 NOTE — ED Provider Notes (Signed)
History     CSN: [id number]  Arrival date & time 10/28/12  1845   First MD Initiated Contact with Patient 10/28/12 2337      Chief Complaint  Patient presents with  . Abdominal Pain    (Consider location/radiation/quality/duration/timing/severity/associated sxs/prior treatment) Patient is a 32 y.o. [name] presenting with abdominal pain. The history is provided by the patient.  Abdominal Pain Pain location:  RUQ and periumbilical Pain quality: sharp   Pain radiates to:  Does not radiate Associated symptoms comment:  Abdominal pain that is chronic, worse since initial evaluation on March 8th in emergency department. At that time a CT scan done showed a ventral hernia without evidence of incarceration or strangulation. She denies fever, nausea, vomiting. She does report the pain is increased with deep breathing and with eating. They did attempt to contact [facility] as referred but were unable to meet the financial obligation.   Past Medical History  Diagnosis Date  . No pertinent past medical history   . Umbilical hernia     Past Surgical History  Procedure Laterality Date  . Cesarean section    . Anal fissure repair      No family history on file.  History  Substance Use Topics  . Smoking status: Never Smoker   . Smokeless tobacco: Not on file  . Alcohol Use: No    OB History   Grav Para Term Preterm Abortions TAB SAB Ect Mult Living   2 2 2       2       Review of Systems  Gastrointestinal: Positive for abdominal pain.    Allergies  Review of patient's allergies indicates no known allergies.  Home Medications   Current Outpatient Rx  Name  Route  Sig  Dispense  Refill  . docusate sodium (COLACE) 100 MG capsule   Oral   Take 1 capsule (100 mg total) by mouth every 12 (twelve) hours.   60 capsule   0   . oxyCODONE-acetaminophen (PERCOCET) 5-325 MG per tablet   Oral   Take 2 tablets by mouth every 4 (four) hours as needed for pain.   20 tablet   0      BP 122/71  Temp(Src) 98.8 F (37.1 C) (Oral)  Resp 14  SpO2 98%  Physical Exam  Constitutional: She is oriented to person, place, and time.  Pulmonary/Chest: Effort normal. She has no wheezes. She has no rales. She exhibits no tenderness.  Abdominal: Soft. Bowel sounds are normal. There is tenderness.  Large, easily reducible ventral hernia. No mass. Moderately tender to periumbilical and RUQ abdomen.  Musculoskeletal: Normal range of motion.  Neurological: She is alert and oriented to person, place, and time.    ED Course  Procedures (including critical care time)  Labs Reviewed  COMPREHENSIVE METABOLIC PANEL - Abnormal; Notable for the following:    Glucose, Bld 106 (*)    Alkaline Phosphatase 119 (*)    Total Bilirubin 0.2 (*)    All other components within normal limits  CBC WITH DIFFERENTIAL  URINALYSIS, ROUTINE W REFLEX MICROSCOPIC   Results for orders placed during the hospital encounter of 10/28/12  URINALYSIS, ROUTINE W REFLEX MICROSCOPIC      Result Value Range   Color, Urine YELLOW  YELLOW   APPearance CLEAR  CLEAR   Specific Gravity, Urine 1.014  1.005 - 1.030   pH 5.5  5.0 - 8.0   Glucose, UA NEGATIVE  NEGATIVE mg/dL   Hgb urine dipstick NEGATIVE  NEGATIVE   Bilirubin Urine NEGATIVE  NEGATIVE   Ketones, ur NEGATIVE  NEGATIVE mg/dL   Protein, ur NEGATIVE  NEGATIVE mg/dL   Urobilinogen, UA 0.2  0.0 - 1.0 mg/dL   Nitrite NEGATIVE  NEGATIVE   Leukocytes, UA NEGATIVE  NEGATIVE  CBC WITH DIFFERENTIAL      Result Value Range   WBC 7.6  4.0 - 10.5 K/uL   RBC 5.07  3.87 - 5.11 MIL/uL   Hemoglobin 14.2  12.0 - 15.0 g/dL   HCT 96.0  45.4 - 09.8 %   MCV 83.4  78.0 - 100.0 fL   MCH 28.0  26.0 - 34.0 pg   MCHC 33.6  30.0 - 36.0 g/dL   RDW 11.9  14.7 - 82.9 %   Platelets 216  150 - 400 K/uL   Neutrophils Relative 48  43 - 77 %   Neutro Abs 3.6  1.7 - 7.7 K/uL   Lymphocytes Relative 44  12 - 46 %   Lymphs Abs 3.3  0.7 - 4.0 K/uL   Monocytes Relative 7  3 -  12 %   Monocytes Absolute 0.5  0.1 - 1.0 K/uL   Eosinophils Relative 1  0 - 5 %   Eosinophils Absolute 0.1  0.0 - 0.7 K/uL   Basophils Relative 1  0 - 1 %   Basophils Absolute 0.0  0.0 - 0.1 K/uL  COMPREHENSIVE METABOLIC PANEL      Result Value Range   Sodium 142  135 - 145 mEq/L   Potassium 3.7  3.5 - 5.1 mEq/L   Chloride 106  96 - 112 mEq/L   CO2 25  19 - 32 mEq/L   Glucose, Bld 106 (*) Heidi - 99 mg/dL   BUN 13  6 - 23 mg/dL   Creatinine, Ser 5.62  0.50 - 1.10 mg/dL   Calcium 9.6  8.4 - 13.0 mg/dL   Total Protein 7.6  6.0 - 8.3 g/dL   Albumin 3.7  3.5 - 5.2 g/dL   AST 21  0 - 37 U/L   ALT 19  0 - 35 U/L   Alkaline Phosphatase 119 (*) 39 - 117 U/L   Total Bilirubin 0.2 (*) 0.3 - 1.2 mg/dL   GFR calc non Af Amer >90  >90 mL/min   GFR calc Af Amer >90  >90 mL/min  POCT PREGNANCY, URINE      Result Value Range   Preg Test, Ur NEGATIVE  NEGATIVE   Ct Abdomen Pelvis W Contrast  10/16/2012  *RADIOLOGY REPORT*  Clinical Data: History of hernia and epigastric region.  Right lower quadrant pain.  CT ABDOMEN AND PELVIS WITH CONTRAST  Technique:  Multidetector CT imaging of the abdomen and pelvis was performed following the standard protocol during bolus administration of intravenous contrast.  Contrast: OMNIPAQUE IOHEXOL 300 MG/ML  SOLN  Comparison: None.  Findings: Mild dependent atelectasis is seen in the lung bases. No pleural or pericardial effusion.  The gallbladder, liver, spleen, adrenal glands, pancreas and kidneys are unremarkable.  There is laxity of the anterior abdominal wall with a small hernia at the umbilicus identified. Small and large bowel loops extends into the lax anterior abdominal wall and hernia but there is no evidence of bowel obstruction, incarceration or ischemia.  The appendix is visualized and normal in appearance.  Fairly large stool burden through the colon is noted.  Small bowel is unremarkable.  IUD is in place the uterus. Urinary bladder appears normal.  No  focal bony abnormality.  IMPRESSION:  1.  Negative for appendicitis.  No acute abnormality. 2.  Laxity of the anterior abdominal wall with a small ventral hernia at the umbilicus.  Negative for evidence of bowel obstruction, incarceration or other complicating feature.   Original Report Authenticated By: Holley Dexter, M.D.    Dg Abd Acute W/chest  10/29/2012  *RADIOLOGY REPORT*  Clinical Data: Right upper quadrant and umbilical pain.  Abdominal hernia.  ACUTE ABDOMEN SERIES (ABDOMEN 2 VIEW & CHEST 1 VIEW)  Comparison: CT abdomen and pelvis 10/16/2012  Findings: Focal opacification of the right cardiophrenic angle is consistent with focal fat on the prior CT scan.  Lungs appear otherwise clear and expanded.  No blunting of costophrenic angles. No pneumothorax.  Mediastinal contours appear intact.  Normal heart size and pulmonary vascularity.  Gas and stool throughout the colon.  No small or large bowel distension.  No free intra-abdominal air.  No abnormal air fluid levels.  Metallic structure in the pelvis consistent with intrauterine device.  No radiopaque stones.  Visualized bones appear intact.  IMPRESSION: No evidence of active pulmonary disease.  Probable prominent cardiac fat pad in the right cardiophrenic angle.  Normal nonobstructive bowel gas pattern with stool filled colon.   Original Report Authenticated By: Burman Nieves, M.D.    No results found.   No diagnosis found. 1. Ventral hernia w/o incarceration 2. Constipation   MDM  Records reviewed - CT scan on 10/16/12 showing hernia w/o incarceration; comment on normal gall bladder. Plain film today shows constipation as well. Encouraged surgical follow up for elective hernia repair. Will treat constipation.        Arnoldo Hooker, PA-C 10/29/12 [phone]

## 2012-11-08 ENCOUNTER — Ambulatory Visit (INDEPENDENT_AMBULATORY_CARE_PROVIDER_SITE_OTHER): Payer: Self-pay | Admitting: Surgery

## 2012-11-08 ENCOUNTER — Encounter (INDEPENDENT_AMBULATORY_CARE_PROVIDER_SITE_OTHER): Payer: Self-pay | Admitting: Surgery

## 2012-11-08 VITALS — BP 104/62 | HR 76 | Resp 18 | Ht 65.0 in | Wt 193.0 lb

## 2012-11-08 DIAGNOSIS — K429 Umbilical hernia without obstruction or gangrene: Secondary | ICD-10-CM

## 2012-11-08 DIAGNOSIS — K432 Incisional hernia without obstruction or gangrene: Secondary | ICD-10-CM | POA: Insufficient documentation

## 2012-11-08 NOTE — Progress Notes (Signed)
Patient ID: Heidi [name], female   DOB: 01/17/1981, 32 y.o.   MRN: [id number]  Chief Complaint  Patient presents with  . Other    Eval abdominal wall hernia    HPI Heidi [name] is a 32 y.o. female.   HPI She has been referred here by the emergency department for symptomatic umbilical hernia. She actually has significant laxity of her abdominal wall as well as a hernia postpartum. She has abdominal pain but no obstructive symptoms. Past Medical History  Diagnosis Date  . No pertinent past medical history   . Umbilical hernia     Past Surgical History  Procedure Laterality Date  . Cesarean section  05/15/2012  . Anal fissure repair  08/16/2009  . Cesarean section  12/03/2010    History reviewed. No pertinent family history.  Social History History  Substance Use Topics  . Smoking status: Never Smoker   . Smokeless tobacco: Not on file  . Alcohol Use: No    No Known Allergies  Current Outpatient Prescriptions  Medication Sig Dispense Refill  . docusate sodium (COLACE) 100 MG capsule Take 1 capsule (100 mg total) by mouth every 12 (twelve) hours.  60 capsule  0  . oxyCODONE-acetaminophen (PERCOCET) 5-325 MG per tablet Take 2 tablets by mouth every 4 (four) hours as needed for pain.  20 tablet  0  . polyethylene glycol (MIRALAX) packet Take 17 g by mouth daily. Maximum 3 consecutive days  10 each  0   No current facility-administered medications for this visit.    Review of Systems Review of Systems  Constitutional: Negative for fever, chills and unexpected weight change.  HENT: Negative for hearing loss, congestion, sore throat, trouble swallowing and voice change.   Eyes: Negative for visual disturbance.  Respiratory: Negative for cough and wheezing.   Cardiovascular: Negative for chest pain, palpitations and leg swelling.  Gastrointestinal: Positive for abdominal pain and constipation. Negative for vomiting, diarrhea, blood in stool, abdominal distention and anal  bleeding.  Genitourinary: Negative for hematuria, vaginal bleeding and difficulty urinating.  Musculoskeletal: Negative for arthralgias.  Skin: Negative for rash and wound.  Neurological: Negative for seizures, syncope and headaches.  Hematological: Negative for adenopathy. Does not bruise/bleed easily.  Psychiatric/Behavioral: Negative for confusion.    Blood pressure 104/62, pulse 76, resp. rate 18, height 5\' 5"  (1.651 m), weight 193 lb (87.544 kg).  Physical Exam Physical Exam  Constitutional: She is oriented to person, place, and time. She appears well-developed and well-nourished. No distress.  HENT:  Head: Normocephalic and atraumatic.  Right Ear: External ear normal.  Left Ear: External ear normal.  Nose: Nose normal.  Mouth/Throat: Oropharynx is clear and moist.  Eyes: Conjunctivae are normal. Pupils are equal, round, and reactive to light. Right eye exhibits no discharge. Left eye exhibits no discharge. No scleral icterus.  Neck: Normal range of motion. Neck supple. No tracheal deviation present. No thyromegaly present.  Cardiovascular: Normal rate, regular rhythm, normal heart sounds and intact distal pulses.   No murmur heard. Pulmonary/Chest: Effort normal and breath sounds normal. No respiratory distress. She has no wheezes.  Abdominal: Soft. Bowel sounds are normal. She exhibits no distension. There is no tenderness.  She has a reducible hernia at the umbilicus with surrounding weakness of the abdominal wall as well as rectus diastases  Musculoskeletal: Normal range of motion. She exhibits no edema and no tenderness.  Lymphadenopathy:    She has no cervical adenopathy.  Neurological: She is alert and oriented to person, place,  and time.  Skin: Skin is warm and dry. No rash noted. She is not diaphoretic. No erythema.  Psychiatric: Her behavior is normal. Judgment normal.    Data Reviewed I have reviewed the CAT scan of her abdomen and pelvis  Assessment     Symptomatic umbilical hernia     Plan    I discussed repair of the hernia with mesh. I discussed with the open and laparoscopic techniques. She does have significant laxity to her abdominal wall. I explained to her and her husband that I would not be  Able to correct the diastases. They would like to try to do this laparoscopic to fill is reasonable to fix the hernia with mesh. I discussed the risks of surgery which includes but is not limited to bleeding, infection, injury to Surrounding structures, need to convert to an open procedure, Recurrence, etc. They understand and wished to proceed with laparoscopic repair with mesh       Heidi [name] A 11/08/2012, 4:08 PM

## 2012-12-07 ENCOUNTER — Encounter (HOSPITAL_COMMUNITY): Payer: Self-pay | Admitting: Pharmacy Technician

## 2012-12-13 ENCOUNTER — Other Ambulatory Visit (HOSPITAL_COMMUNITY): Payer: Self-pay | Admitting: *Deleted

## 2012-12-13 ENCOUNTER — Inpatient Hospital Stay (HOSPITAL_COMMUNITY): Admission: RE | Admit: 2012-12-13 | Payer: Self-pay | Source: Ambulatory Visit

## 2012-12-14 ENCOUNTER — Encounter (HOSPITAL_COMMUNITY): Payer: Self-pay

## 2012-12-14 ENCOUNTER — Encounter (HOSPITAL_COMMUNITY)
Admission: RE | Admit: 2012-12-14 | Discharge: 2012-12-14 | Disposition: A | Discharge status: Home / Self Care | Payer: Self-pay | Source: Ambulatory Visit | Attending: Surgery | Admitting: Surgery

## 2012-12-14 HISTORY — DX: Headache: R51

## 2012-12-14 HISTORY — DX: Shortness of breath: R06.02

## 2012-12-14 LAB — CBC
Hemoglobin: 14.1 g/dL (ref 12.0–15.0)
MCH: 28 pg (ref 26.0–34.0)
MCHC: 32.7 g/dL (ref 30.0–36.0)
Platelets: 231 10*3/uL (ref 150–400)
WBC: 7.5 10*3/uL (ref 4.0–10.5)

## 2012-12-14 LAB — HCG, SERUM, QUALITATIVE: Preg, Serum: NEGATIVE

## 2012-12-14 NOTE — Pre-Procedure Instructions (Signed)
Patient's husband , Conception Chancy will translate for her and paper signed .

## 2012-12-14 NOTE — Patient Instructions (Addendum)
20      Your procedure is scheduled on:  Monday 12/20/2012  Report to [facility] at  0630 AM.  Call this number if you have problems the morning of surgery: [phone]   Remember:             IF YOU USE CPAP,BRING MASK AND TUBING AM OF SURGERY!   Do not eat food or drink liquids AFTER MIDNIGHT!  Take these medicines the morning of surgery with A SIP OF WATER: NONE   Do not bring valuables to the hospital.  .  Leave suitcase in the car. After surgery it may be brought to your room.  For patients admitted to the hospital, checkout time is 11:00 AM the day of              Discharge.    DO NOT WEAR JEWELRY , MAKE-UP, LOTIONS,POWDERS,PERFUMES!             WOMEN -DO NOT SHAVE LEGS OR UNDERARMS 12 HRS. BEFORE  SURGERY!               MEN MAY SHAVE AS USUAL!             CONTACTS,DENTURES OR BRIDGEWORK, FALSE EYELASHES MAY NOT BE WORN INTO SURGERY!                                           Patients discharged the day of surgery will not be allowed to drive home.  If going home the same day of surgery, must have someone stay with you  first 24 hrs.at home and arrange for someone to drive you home from the Hospital.                          YOUR DRIVER IS: [facility]   Special Instructions:             Please read over the following fact sheets that you were given:             1. [place] PREPARING FOR SURGERY SHEET              2.[facility] INFORMATION              3.[facility]                                        Telford Nab.Nanney,RN,BSN     [phone]                FAILURE TO FOLLOW THESE INSTRUCTIONS MAY RESULT IN CANCELLATION OF YOUR SURGERY!               Patient Signature:___________________________

## 2012-12-18 NOTE — H&P (Signed)
Patient ID: Heidi [name], female DOB: Dec 25, 1980, 32 y.o. MRN: [id number]  Chief Complaint   Patient presents with   .  Other     Eval abdominal wall hernia   HPI  Heidi [name] is a 32 y.o. female.  HPI  She has been referred here by the emergency department for symptomatic umbilical hernia. She actually has significant laxity of her abdominal wall as well as a hernia postpartum. She has abdominal pain but no obstructive symptoms.  Past Medical History   Diagnosis  Date   .  No pertinent past medical history    .  Umbilical hernia     Past Surgical History   Procedure  Laterality  Date   .  Cesarean section   05/15/2012   .  Anal fissure repair   08/16/2009   .  Cesarean section   12/03/2010   History reviewed. No pertinent family history.  Social History  History   Substance Use Topics   .  Smoking status:  Never Smoker   .  Smokeless tobacco:  Not on file   .  Alcohol Use:  No   No Known Allergies  Current Outpatient Prescriptions   Medication  Sig  Dispense  Refill   .  docusate sodium (COLACE) 100 MG capsule  Take 1 capsule (100 mg total) by mouth every 12 (twelve) hours.  60 capsule  0   .  oxyCODONE-acetaminophen (PERCOCET) 5-325 MG per tablet  Take 2 tablets by mouth every 4 (four) hours as needed for pain.  20 tablet  0   .  polyethylene glycol (MIRALAX) packet  Take 17 g by mouth daily. Maximum 3 consecutive days  10 each  0    No current facility-administered medications for this visit.   Review of Systems  Review of Systems  Constitutional: Negative for fever, chills and unexpected weight change.  HENT: Negative for hearing loss, congestion, sore throat, trouble swallowing and voice change.  Eyes: Negative for visual disturbance.  Respiratory: Negative for cough and wheezing.  Cardiovascular: Negative for chest pain, palpitations and leg swelling.  Gastrointestinal: Positive for abdominal pain and constipation. Negative for vomiting, diarrhea, blood in stool,  abdominal distention and anal bleeding.  Genitourinary: Negative for hematuria, vaginal bleeding and difficulty urinating.  Musculoskeletal: Negative for arthralgias.  Skin: Negative for rash and wound.  Neurological: Negative for seizures, syncope and headaches.  Hematological: Negative for adenopathy. Does not bruise/bleed easily.  Psychiatric/Behavioral: Negative for confusion.  Blood pressure 104/62, pulse 76, resp. rate 18, height 5\' 5"  (1.651 m), weight 193 lb (87.544 kg).  Physical Exam  Physical Exam  Constitutional: She is oriented to person, place, and time. She appears well-developed and well-nourished. No distress.  HENT:  Head: Normocephalic and atraumatic.  Right Ear: External ear normal.  Left Ear: External ear normal.  Nose: Nose normal.  Mouth/Throat: Oropharynx is clear and moist.  Eyes: Conjunctivae are normal. Pupils are equal, round, and reactive to light. Right eye exhibits no discharge. Left eye exhibits no discharge. No scleral icterus.  Neck: Normal range of motion. Neck supple. No tracheal deviation present. No thyromegaly present.  Cardiovascular: Normal rate, regular rhythm, normal heart sounds and intact distal pulses.  No murmur heard.  Pulmonary/Chest: Effort normal and breath sounds normal. No respiratory distress. She has no wheezes.  Abdominal: Soft. Bowel sounds are normal. She exhibits no distension. There is no tenderness.  She has a reducible hernia at the umbilicus with surrounding weakness of the abdominal wall  as well as rectus diastases  Musculoskeletal: Normal range of motion. She exhibits no edema and no tenderness.  Lymphadenopathy:  She has no cervical adenopathy.  Neurological: She is alert and oriented to person, place, and time.  Skin: Skin is warm and dry. No rash noted. She is not diaphoretic. No erythema.  Psychiatric: Her behavior is normal. Judgment normal.  Data Reviewed  I have reviewed the CAT scan of her abdomen and pelvis    Assessment  Symptomatic umbilical hernia   Plan  I discussed repair of the hernia with mesh. I discussed with the open and laparoscopic techniques. She does have significant laxity to her abdominal wall. I explained to her and her husband that I would not be Able to correct the diastases. They would like to try to do this laparoscopic to fill is reasonable to fix the hernia with mesh. I discussed the risks of surgery which includes but is not limited to bleeding, infection, injury to Surrounding structures, need to convert to an open procedure, Recurrence, etc. They understand and wished to proceed with laparoscopic repair with mesh

## 2012-12-20 ENCOUNTER — Inpatient Hospital Stay (HOSPITAL_COMMUNITY)
Admission: RE | Admit: 2012-12-20 | Discharge: 2012-12-22 | DRG: 355 | Disposition: A | Discharge status: Home / Self Care | Payer: MEDICAID | Source: Ambulatory Visit | Attending: Surgery | Admitting: Surgery

## 2012-12-20 ENCOUNTER — Encounter (HOSPITAL_COMMUNITY): Payer: Self-pay | Admitting: *Deleted

## 2012-12-20 ENCOUNTER — Encounter (HOSPITAL_COMMUNITY): Payer: Self-pay | Admitting: Anesthesiology

## 2012-12-20 ENCOUNTER — Encounter (HOSPITAL_COMMUNITY)
Admission: RE | Disposition: A | Discharge status: Home / Self Care | Payer: Self-pay | Source: Ambulatory Visit | Attending: Surgery

## 2012-12-20 ENCOUNTER — Ambulatory Visit (HOSPITAL_COMMUNITY): Payer: Self-pay | Admitting: Anesthesiology

## 2012-12-20 DIAGNOSIS — K432 Incisional hernia without obstruction or gangrene: Secondary | ICD-10-CM

## 2012-12-20 DIAGNOSIS — K429 Umbilical hernia without obstruction or gangrene: Principal | ICD-10-CM | POA: Diagnosis present

## 2012-12-20 HISTORY — PX: INSERTION OF MESH: SHX5868

## 2012-12-20 HISTORY — PX: UMBILICAL HERNIA REPAIR: SHX196

## 2012-12-20 LAB — CBC
HCT: 43.2 % (ref 36.0–46.0)
Hemoglobin: 14.1 g/dL (ref 12.0–15.0)
MCV: 86.4 fL (ref 78.0–100.0)
Platelets: 202 10*3/uL (ref 150–400)
RBC: 5 MIL/uL (ref 3.87–5.11)
WBC: 10.3 10*3/uL (ref 4.0–10.5)

## 2012-12-20 SURGERY — REPAIR, HERNIA, UMBILICAL, LAPAROSCOPIC
Anesthesia: General | Site: Abdomen | Wound class: Clean

## 2012-12-20 MED ORDER — NEOSTIGMINE METHYLSULFATE 1 MG/ML IJ SOLN
INTRAMUSCULAR | Status: DC | PRN
  Administered 2012-12-20: 3 mg via INTRAVENOUS

## 2012-12-20 MED ORDER — OXYCODONE-ACETAMINOPHEN 5-325 MG PO TABS
1.0000 | ORAL_TABLET | ORAL | Status: DC | PRN
  Administered 2012-12-20 – 2012-12-22 (×7): 1 via ORAL
  Administered 2012-12-22: 2 via ORAL
  Filled 2012-12-20 (×9): qty 1

## 2012-12-20 MED ORDER — FENTANYL CITRATE 0.05 MG/ML IJ SOLN
INTRAMUSCULAR | Status: AC
  Filled 2012-12-20: qty 2

## 2012-12-20 MED ORDER — FENTANYL CITRATE 0.05 MG/ML IJ SOLN
25.0000 ug | INTRAMUSCULAR | Status: DC | PRN
  Administered 2012-12-20 (×2): 50 ug via INTRAVENOUS

## 2012-12-20 MED ORDER — MEPERIDINE HCL 50 MG/ML IJ SOLN: 6.2500 mg | INTRAMUSCULAR | Status: DC | PRN

## 2012-12-20 MED ORDER — BUPIVACAINE HCL (PF) 0.5 % IJ SOLN
INTRAMUSCULAR | Status: DC | PRN
  Administered 2012-12-20: 20 mL

## 2012-12-20 MED ORDER — LACTATED RINGERS IV SOLN
INTRAVENOUS | Status: DC | PRN
  Administered 2012-12-20: 08:00:00 via INTRAVENOUS

## 2012-12-20 MED ORDER — IBUPROFEN 600 MG PO TABS
600.0000 mg | ORAL_TABLET | Freq: Four times a day (QID) | ORAL | Status: DC | PRN
  Filled 2012-12-20: qty 1

## 2012-12-20 MED ORDER — ONDANSETRON HCL 4 MG/2ML IJ SOLN
INTRAMUSCULAR | Status: DC | PRN
  Administered 2012-12-20: 4 mg via INTRAVENOUS

## 2012-12-20 MED ORDER — ROCURONIUM BROMIDE 100 MG/10ML IV SOLN
INTRAVENOUS | Status: DC | PRN
  Administered 2012-12-20: 20 mg via INTRAVENOUS

## 2012-12-20 MED ORDER — MIDAZOLAM HCL 5 MG/5ML IJ SOLN
INTRAMUSCULAR | Status: DC | PRN
  Administered 2012-12-20 (×2): 1 mg via INTRAVENOUS

## 2012-12-20 MED ORDER — FENTANYL CITRATE 0.05 MG/ML IJ SOLN
INTRAMUSCULAR | Status: DC | PRN
  Administered 2012-12-20 (×2): 50 ug via INTRAVENOUS

## 2012-12-20 MED ORDER — PROMETHAZINE HCL 25 MG/ML IJ SOLN: 6.2500 mg | INTRAMUSCULAR | Status: DC | PRN

## 2012-12-20 MED ORDER — ACETAMINOPHEN 10 MG/ML IV SOLN
INTRAVENOUS | Status: AC
  Filled 2012-12-20: qty 100

## 2012-12-20 MED ORDER — ENOXAPARIN SODIUM 40 MG/0.4ML ~~LOC~~ SOLN
40.0000 mg | SUBCUTANEOUS | Status: DC
  Administered 2012-12-21 – 2012-12-22 (×2): 40 mg via SUBCUTANEOUS
  Filled 2012-12-20 (×3): qty 0.4

## 2012-12-20 MED ORDER — 0.9 % SODIUM CHLORIDE (POUR BTL) OPTIME
TOPICAL | Status: DC | PRN
  Administered 2012-12-20: 1000 mL

## 2012-12-20 MED ORDER — SUCCINYLCHOLINE CHLORIDE 20 MG/ML IJ SOLN
INTRAMUSCULAR | Status: DC | PRN
  Administered 2012-12-20: 100 mg via INTRAVENOUS

## 2012-12-20 MED ORDER — POTASSIUM CHLORIDE IN NACL 20-0.9 MEQ/L-% IV SOLN
INTRAVENOUS | Status: DC
  Administered 2012-12-20 – 2012-12-22 (×4): via INTRAVENOUS
  Filled 2012-12-20 (×6): qty 1000

## 2012-12-20 MED ORDER — GLYCOPYRROLATE 0.2 MG/ML IJ SOLN
INTRAMUSCULAR | Status: DC | PRN
  Administered 2012-12-20: 0.4 mg via INTRAVENOUS

## 2012-12-20 MED ORDER — MORPHINE SULFATE 4 MG/ML IJ SOLN: 4.0000 mg | INTRAMUSCULAR | Status: DC | PRN

## 2012-12-20 MED ORDER — PROPOFOL 10 MG/ML IV BOLUS
INTRAVENOUS | Status: DC | PRN
  Administered 2012-12-20: 180 mg via INTRAVENOUS

## 2012-12-20 MED ORDER — CEFAZOLIN SODIUM-DEXTROSE 2-3 GM-% IV SOLR
INTRAVENOUS | Status: AC
  Filled 2012-12-20: qty 50

## 2012-12-20 MED ORDER — LIDOCAINE HCL (CARDIAC) 20 MG/ML IV SOLN
INTRAVENOUS | Status: DC | PRN
  Administered 2012-12-20: 100 mg via INTRAVENOUS

## 2012-12-20 MED ORDER — ACETAMINOPHEN 10 MG/ML IV SOLN
INTRAVENOUS | Status: DC | PRN
  Administered 2012-12-20: 1000 mg via INTRAVENOUS

## 2012-12-20 MED ORDER — BUPIVACAINE HCL (PF) 0.5 % IJ SOLN
INTRAMUSCULAR | Status: AC
  Filled 2012-12-20: qty 30

## 2012-12-20 MED ORDER — LACTATED RINGERS IV SOLN: INTRAVENOUS | Status: DC

## 2012-12-20 MED ORDER — BUPIVACAINE-EPINEPHRINE PF 0.25-1:200000 % IJ SOLN
INTRAMUSCULAR | Status: AC
  Filled 2012-12-20: qty 30

## 2012-12-20 MED ORDER — CEFAZOLIN SODIUM-DEXTROSE 2-3 GM-% IV SOLR
2.0000 g | INTRAVENOUS | Status: AC
  Administered 2012-12-20: 2 g via INTRAVENOUS

## 2012-12-20 SURGICAL SUPPLY — 46 items
BENZOIN TINCTURE PRP APPL 2/3 (GAUZE/BANDAGES/DRESSINGS) ×2 IMPLANT
BLADE SURG SZ11 CARB STEEL (BLADE) ×2 IMPLANT
CABLE HIGH FREQUENCY MONO STRZ (ELECTRODE) ×2 IMPLANT
CANISTER SUCTION 2500CC (MISCELLANEOUS) ×2 IMPLANT
CLOTH BEACON ORANGE TIMEOUT ST (SAFETY) ×2 IMPLANT
DECANTER SPIKE VIAL GLASS SM (MISCELLANEOUS) ×2 IMPLANT
DERMABOND ADVANCED (GAUZE/BANDAGES/DRESSINGS) ×1
DERMABOND ADVANCED .7 DNX12 (GAUZE/BANDAGES/DRESSINGS) ×1 IMPLANT
DEVICE SECURE STRAP 25 ABSORB (INSTRUMENTS) ×2 IMPLANT
DEVICE TROCAR PUNCTURE CLOSURE (ENDOMECHANICALS) ×2 IMPLANT
DRAPE INCISE IOBAN 66X45 STRL (DRAPES) ×2 IMPLANT
DRAPE LAPAROSCOPIC ABDOMINAL (DRAPES) ×2 IMPLANT
ELECT REM PT RETURN 9FT ADLT (ELECTROSURGICAL) ×2
ELECTRODE REM PT RTRN 9FT ADLT (ELECTROSURGICAL) ×1 IMPLANT
GLOVE BIO SURGEON STRL SZ7 (GLOVE) ×4 IMPLANT
GLOVE BIOGEL PI IND STRL 7.0 (GLOVE) ×1 IMPLANT
GLOVE BIOGEL PI IND STRL 7.5 (GLOVE) ×2 IMPLANT
GLOVE BIOGEL PI INDICATOR 7.0 (GLOVE) ×1
GLOVE BIOGEL PI INDICATOR 7.5 (GLOVE) ×2
GLOVE ECLIPSE 8.0 STRL XLNG CF (GLOVE) ×2 IMPLANT
GLOVE SURG SIGNA 7.5 PF LTX (GLOVE) ×2 IMPLANT
GOWN PREVENTION PLUS LG XLONG (DISPOSABLE) ×2 IMPLANT
GOWN PREVENTION PLUS XLARGE (GOWN DISPOSABLE) ×2 IMPLANT
GOWN STRL NON-REIN LRG LVL3 (GOWN DISPOSABLE) ×2 IMPLANT
GOWN STRL REIN XL XLG (GOWN DISPOSABLE) ×2 IMPLANT
KIT BASIN OR (CUSTOM PROCEDURE TRAY) ×2 IMPLANT
MARKER SKIN DUAL TIP RULER LAB (MISCELLANEOUS) ×2 IMPLANT
MESH PHYSIO OVAL 10X15CM (Mesh General) ×2 IMPLANT
NS IRRIG 1000ML POUR BTL (IV SOLUTION) ×2 IMPLANT
SCISSORS LAP 5X35 DISP (ENDOMECHANICALS) ×2 IMPLANT
SET IRRIG TUBING LAPAROSCOPIC (IRRIGATION / IRRIGATOR) IMPLANT
SOLUTION ANTI FOG 6CC (MISCELLANEOUS) ×2 IMPLANT
STAPLER VISISTAT 35W (STAPLE) ×2 IMPLANT
STRIP CLOSURE SKIN 1/2X4 (GAUZE/BANDAGES/DRESSINGS) ×2 IMPLANT
SUT MNCRL AB 4-0 PS2 18 (SUTURE) ×2 IMPLANT
SUT NOVA NAB GS-21 0 18 T12 DT (SUTURE) ×2 IMPLANT
SUT PROLENE 0 CT 1 CR/8 (SUTURE) IMPLANT
SUT PROLENE 2 0 SH DA (SUTURE) IMPLANT
TACKER 5MM HERNIA 3.5CML NAB (ENDOMECHANICALS) ×2 IMPLANT
TOWEL OR 17X26 10 PK STRL BLUE (TOWEL DISPOSABLE) ×2 IMPLANT
TRAY FOLEY CATH 14FRSI W/METER (CATHETERS) ×2 IMPLANT
TRAY LAP CHOLE (CUSTOM PROCEDURE TRAY) ×2 IMPLANT
TROCAR BALLN 12MMX100 BLUNT (TROCAR) ×2 IMPLANT
TROCAR BLADELESS OPT 5 75 (ENDOMECHANICALS) ×4 IMPLANT
TROCAR XCEL NON-BLD 11X100MML (ENDOMECHANICALS) ×2 IMPLANT
TUBING INSUFFLATION 10FT LAP (TUBING) ×2 IMPLANT

## 2012-12-20 NOTE — Transfer of Care (Signed)
Immediate Anesthesia Transfer of Care Note  Patient: Heidi [name]  Procedure(s) Performed: Procedure(s): LAPAROSCOPIC UMBILICAL HERNIA (N/A) INSERTION OF MESH (N/A)  Patient Location: PACU  Anesthesia Type:General  Level of Consciousness: sedated  Airway & Oxygen Therapy: Patient Spontanous Breathing and Patient connected to face mask oxygen  Post-op Assessment: Report given to PACU RN and Post -op Vital signs reviewed and stable  Post vital signs: Reviewed and stable  Complications: No apparent anesthesia complications

## 2012-12-20 NOTE — Anesthesia Preprocedure Evaluation (Signed)

## 2012-12-20 NOTE — Progress Notes (Addendum)
Utilization Review completed.  Amy Ferrero RN CM  

## 2012-12-20 NOTE — Op Note (Signed)
LAPAROSCOPIC UMBILICAL HERNIA, INSERTION OF MESH  Procedure Note  Heidi [name] 12/20/2012   Pre-op Diagnosis: hernia     Post-op Diagnosis: same  Procedure(s): LAPAROSCOPIC UMBILICAL HERNIA INSERTION OF MESH (10cm x 15cm Physio)  Surgeon(s): Shelly Rubenstein, MD  Anesthesia: General  Staff:  Circulator: Loann Quill Dertouzos, RN Relief Circulator: Dominga Ferry, RN Relief Scrub: Dominga Ferry, RN Scrub Person: Gerda Diss, RN; Dutch Quint, CST  Estimated Blood Loss: Minimal                         Heidi [name] A   Date: 12/20/2012  Time: 9:05 AM

## 2012-12-20 NOTE — Interval H&P Note (Signed)
History and Physical Interval Note: no change in H and P  12/20/2012 6:43 AM  Heidi [name]  has presented today for surgery, with the diagnosis of hernia  The various methods of treatment have been discussed with the patient and family. After consideration of risks, benefits and other options for treatment, the patient has consented to  Procedure(s): LAPAROSCOPIC UMBILICAL HERNIA (N/A) INSERTION OF MESH (N/A) as a surgical intervention .  The patient's history has been reviewed, patient examined, no change in status, stable for surgery.  I have reviewed the patient's chart and labs.  Questions were answered to the patient's satisfaction.     Heidi [name] A

## 2012-12-20 NOTE — Anesthesia Postprocedure Evaluation (Signed)
  Anesthesia Post-op Note  Patient: Heidi [name]  Procedure(s) Performed: Procedure(s) (LRB): LAPAROSCOPIC UMBILICAL HERNIA (N/A) INSERTION OF MESH (N/A)  Patient Location: PACU  Anesthesia Type: General  Level of Consciousness: awake and alert   Airway and Oxygen Therapy: Patient Spontanous Breathing  Post-op Pain: mild  Post-op Assessment: Post-op Vital signs reviewed, Patient's Cardiovascular Status Stable, Respiratory Function Stable, Patent Airway and No signs of Nausea or vomiting  Last Vitals:  Filed Vitals:   12/20/12 1130  BP: 97/64  Pulse: 69  Temp: 36.3 C  Resp: 18    Post-op Vital Signs: stable   Complications: No apparent anesthesia complications

## 2012-12-20 NOTE — Anesthesia Procedure Notes (Addendum)
Procedure Name: Intubation Date/Time: 12/20/2012 8:24 AM Performed by: Paulla Dolly A Pre-anesthesia Checklist: Patient identified, Emergency Drugs available, Suction available, Patient being monitored and Timeout performed Patient Re-evaluated:Patient Re-evaluated prior to inductionOxygen Delivery Method: Circle system utilized Preoxygenation: Pre-oxygenation with 100% oxygen Intubation Type: IV induction Ventilation: Mask ventilation without difficulty Laryngoscope Size: Mac and 3 Grade View: Grade I Tube type: Oral Tube size: 7.5 mm Number of attempts: 1 Airway Equipment and Method: Stylet Placement Confirmation: ETT inserted through vocal cords under direct vision,  breath sounds checked- equal and bilateral and positive ETCO2 Secured at: 22 cm Tube secured with: Tape Dental Injury: Teeth and Oropharynx as per pre-operative assessment  Comments: Patient intubated by Glenna Fellows

## 2012-12-21 ENCOUNTER — Encounter (HOSPITAL_COMMUNITY): Payer: Self-pay | Admitting: Surgery

## 2012-12-21 NOTE — Op Note (Signed)
Heidi [name], [name] NO.:  [id number]  MEDICAL RECORD NO.:  [id number]  LOCATION:  1528                         FACILITY:  [facility]  PHYSICIAN:  Abigail Miyamoto, M.D. DATE OF BIRTH:  1981-02-06  DATE OF PROCEDURE:  12/20/2012 DATE OF DISCHARGE:                              OPERATIVE REPORT   PREOPERATIVE DIAGNOSIS:  Umbilical hernia.  POSTOPERATIVE DIAGNOSIS:  Umbilical hernia.  PROCEDURE:  Laparoscopic umbilical hernia repair with mesh.  SURGEON:  Abigail Miyamoto, M.D.  ANESTHESIA:  General and 0.5% Marcaine.  ESTIMATED BLOOD LOSS:  Minimal.  INDICATION:  This is a 32 year old female who has a symptomatic hernia at the umbilicus.  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  I explained to the family that I could repair the umbilical hernia in which she would need a tummy tuck surgery to fix the laxity of her abdominal wall and redundant skin.  At this point, they elected to go ahead and proceed with laparoscopic umbilical hernia repair with mesh.  FINDINGS:  The patient was found to have a small fascial defect at the umbilicus, as well as some splaying of the fascia just below this.  I repaired this with a piece of 10 cm x 15 cm Physiomesh.  PROCEDURE IN DETAIL:  The patient was brought to the operating room, identified as the correct patient.  She was placed supine on the operating table and general anesthesia was induced.  Her abdomen was then prepped and draped in usual sterile fashion.  I made a small incision in the patient's left upper quadrant and used a 5 mm trocar and camera Opti-Vu to slowly traverse all levels of the abdominal wall.  It was difficult to enter the peritoneal cavity secondary to lysis of abdominal wall.  After entering the abdominal cavity, I evaluated the port entrance site extensively and saw no evidence of bowel injury. Then insufflation of the abdomen was begun.  I made a separate  incision in the patient's left lower abdomen through a previous scar.  I placed an 11 mm trocar in there and then placed another 5 mm trocar in the patient's right upper quadrant.  I again examined the entrance site from multiple angles and evaluated the bowel and the surrounding area and found no evidence of bowel injury.  I evaluated the area of the abdominal wall and the patient was found to have a small fascial defect at the umbilicus as well as some splaying of the fascia below this. There was no other evidence of any kind of hernia.  At this point, I brought a 10 cm x 15 cm piece of Physiomesh onto the field.  I placed 4 separate sutures into the mesh and then rolled it up and placed it through the 11 mm trocar into the abdominal cavity.  I then unrolled the mesh.  I made 4 separate stab incisions and used the suture passer to pull the sutures up to the abdominal wall circumferentially around the mesh under direct vision.  I then tacked this in place pulling the mesh up against the peritoneal surface.  I then tacked the mesh in circumferentially with the absorbable Tacker.  Good  coverage of the fascial defect appeared to be achieved, much greater than 4 cm circumferentially.  I again evaluated the intestines and saw no evidence of bowel injury.  All trocars were then removed under direct vision and the abdomen was deflated.  All incisions were anesthetized with Marcaine and closed with 4-0 Monocryl subcuticular sutures.  Steri- Strips and Band-Aids were then applied.  The patient tolerated the procedure well.  All the counts were correct at the end of procedure. The patient was then extubated in the operating room and taken in a stable condition to the recovery room.     Abigail Miyamoto, M.D.     DB/MEDQ  D:  12/20/2012  T:  12/21/2012  Job:  [id number]

## 2012-12-21 NOTE — Progress Notes (Signed)
Utilization Review completed.  Cloyce Paterson RN CM  

## 2012-12-21 NOTE — Progress Notes (Signed)
1 Day Post-Op  Subjective: POD#1 Got dizzy and fell last evening Still weak this morning.  No flatus.  Moderate pain  Objective: Vital signs in last 24 hours: Temp:  [97.3 F (36.3 C)-98.9 F (37.2 C)] 97.8 F (36.6 C) (05/13 0619) Pulse Rate:  [62-80] 62 (05/13 0619) Resp:  [13-20] 18 (05/13 0619) BP: (90-107)/(51-75) 90/51 mmHg (05/13 0619) SpO2:  [96 %-100 %] 96 % (05/13 0619) Weight:  [180 lb (81.647 kg)] 180 lb (81.647 kg) (05/12 1216) Last BM Date: 12/19/12  Intake/Output from previous day: 05/12 0701 - 05/13 0700 In: 2896.7 [P.O.:240; I.V.:2656.7] Out: 3400 [Urine:3400] Intake/Output this shift: Total I/O In: 240 [P.O.:240] Out: -   Abdomen mildly distended, soft, appropriately tender  Lab Results:   Recent Labs  12/20/12 1657  WBC 10.3  HGB 14.1  HCT 43.2  PLT 202   BMET No results found for this basename: NA, K, CL, CO2, GLUCOSE, BUN, CREATININE, CALCIUM,  in the last 72 hours PT/INR No results found for this basename: LABPROT, INR,  in the last 72 hours ABG No results found for this basename: PHART, PCO2, PO2, HCO3,  in the last 72 hours  Studies/Results: No results found.  Anti-infectives: Anti-infectives   Start     Dose/Rate Route Frequency Ordered Stop   12/20/12 0700  ceFAZolin (ANCEF) IVPB 2 g/50 mL premix     2 g 100 mL/hr over 30 Minutes Intravenous On call to O.R. 12/20/12 0865 12/20/12 0827      Assessment/Plan: s/p Procedure(s): LAPAROSCOPIC UMBILICAL HERNIA (N/A) INSERTION OF MESH (N/A)  Continue hospital stay for pain control, ileus, close monitoring given fall  LOS: 1 day    Tonica Brasington A 12/21/2012

## 2012-12-22 LAB — CBC
HCT: 41.2 % (ref 36.0–46.0)
Hemoglobin: 13.3 g/dL (ref 12.0–15.0)
MCHC: 32.3 g/dL (ref 30.0–36.0)
MCV: 86.9 fL (ref 78.0–100.0)

## 2012-12-22 MED ORDER — OXYCODONE-ACETAMINOPHEN 5-325 MG PO TABS: 1.0000 | ORAL_TABLET | ORAL | Status: DC | PRN

## 2012-12-22 NOTE — Progress Notes (Signed)
Patient ID: Heidi [name], female   DOB: 04/01/1981, 32 y.o.   MRN: [id number]  Doing better Pain less  WBC normal Abdomen soft  Plan: discharge

## 2012-12-22 NOTE — Discharge Summary (Signed)
Physician Discharge Summary  Patient ID: Heidi [name] MRN: [id number] DOB/AGE: 12/25/1980 31 y.o.  Admit date: 12/20/2012 Discharge date: 12/22/2012  Admission Diagnoses:  Discharge Diagnoses:  Active Problems:   * No active hospital problems. * umbilical hernia  Discharged Condition: good  Hospital Course: admitted post op for pain.  Improved POD#2  Consults: None  Significant Diagnostic Studies:   Treatments: surgery: laparoscopic umbilical hernia repair with mesh  Discharge Exam: Blood pressure 105/56, pulse 79, temperature 98.9 F (37.2 C), temperature source Oral, resp. rate 16, height 5\' 5"  (1.651 m), weight 180 lb (81.647 kg), last menstrual period 12/19/2012, SpO2 98.00%. General appearance: alert, cooperative and no distress Cardio: regular rate and rhythm, S1, S2 normal, no murmur, click, rub or gallop abdomen soft  Disposition: 01-Home or Self Care     Medication List    TAKE these medications       oxyCODONE-acetaminophen 5-325 MG per tablet  Commonly known as:  PERCOCET/ROXICET  Take 1-2 tablets by mouth every 4 (four) hours as needed.           Follow-up Information   Follow up with [facility] A, MD In 2 weeks.   Contact information:   [address] [place] [address] [phone]       Signed: Shelly Rubenstein 12/22/2012, 7:29 AM

## 2012-12-29 ENCOUNTER — Observation Stay (HOSPITAL_COMMUNITY)
Admission: EM | Admit: 2012-12-29 | Discharge: 2012-12-31 | Disposition: A | Discharge status: Home / Self Care | Payer: Self-pay | Attending: Surgery | Admitting: Surgery

## 2012-12-29 ENCOUNTER — Encounter (HOSPITAL_COMMUNITY): Payer: Self-pay | Admitting: *Deleted

## 2012-12-29 ENCOUNTER — Emergency Department (HOSPITAL_COMMUNITY): Payer: Self-pay

## 2012-12-29 DIAGNOSIS — IMO0002 Reserved for concepts with insufficient information to code with codable children: Principal | ICD-10-CM | POA: Insufficient documentation

## 2012-12-29 DIAGNOSIS — Y838 Other surgical procedures as the cause of abnormal reaction of the patient, or of later complication, without mention of misadventure at the time of the procedure: Secondary | ICD-10-CM | POA: Insufficient documentation

## 2012-12-29 DIAGNOSIS — R1033 Periumbilical pain: Secondary | ICD-10-CM | POA: Insufficient documentation

## 2012-12-29 DIAGNOSIS — R188 Other ascites: Secondary | ICD-10-CM

## 2012-12-29 LAB — URINE MICROSCOPIC-ADD ON

## 2012-12-29 LAB — COMPREHENSIVE METABOLIC PANEL
BUN: 14 mg/dL (ref 6–23)
Calcium: 9.1 mg/dL (ref 8.4–10.5)
GFR calc Af Amer: >90 mL/min (ref >90)
Glucose, Bld: 84 mg/dL (ref 70–99)
Sodium: 137 mEq/L (ref 135–145)
Total Protein: 7 g/dL (ref 6.0–8.3)

## 2012-12-29 LAB — CBC WITH DIFFERENTIAL/PLATELET
Eosinophils Absolute: 0.2 10*3/uL (ref 0.0–0.7)
Eosinophils Relative: 2 % (ref 0–5)
Lymphs Abs: 3.1 10*3/uL (ref 0.7–4.0)
MCH: 28.1 pg (ref 26.0–34.0)
MCHC: 32.8 g/dL (ref 30.0–36.0)
MCV: 85.7 fL (ref 78.0–100.0)
Monocytes Relative: 10 % (ref 3–12)
Platelets: 258 10*3/uL (ref 150–400)
RBC: 4.77 MIL/uL (ref 3.87–5.11)

## 2012-12-29 LAB — HCG, SERUM, QUALITATIVE: Preg, Serum: NEGATIVE

## 2012-12-29 LAB — URINALYSIS, ROUTINE W REFLEX MICROSCOPIC
Leukocytes, UA: NEGATIVE
Nitrite: NEGATIVE
Specific Gravity, Urine: 1.021 (ref 1.005–1.030)
pH: 6.5 (ref 5.0–8.0)

## 2012-12-29 MED ORDER — SODIUM CHLORIDE 0.9 % IV SOLN
1000.0000 mL | Freq: Once | INTRAVENOUS | Status: AC
  Administered 2012-12-29: 1000 mL via INTRAVENOUS

## 2012-12-29 MED ORDER — PIPERACILLIN-TAZOBACTAM 3.375 G IVPB
3.3750 g | Freq: Once | INTRAVENOUS | Status: AC
  Administered 2012-12-29: 3.375 g via INTRAVENOUS
  Filled 2012-12-29: qty 50

## 2012-12-29 MED ORDER — IOHEXOL 300 MG/ML  SOLN
100.0000 mL | Freq: Once | INTRAMUSCULAR | Status: AC | PRN
  Administered 2012-12-29: 100 mL via INTRAVENOUS

## 2012-12-29 MED ORDER — MORPHINE SULFATE 4 MG/ML IJ SOLN
6.0000 mg | Freq: Once | INTRAMUSCULAR | Status: AC
  Administered 2012-12-29: 6 mg via INTRAVENOUS
  Filled 2012-12-29: qty 2

## 2012-12-29 MED ORDER — IOHEXOL 300 MG/ML  SOLN
50.0000 mL | Freq: Once | INTRAMUSCULAR | Status: AC | PRN
  Administered 2012-12-29: 50 mL via ORAL

## 2012-12-29 MED ORDER — VANCOMYCIN HCL IN DEXTROSE 1-5 GM/200ML-% IV SOLN
1000.0000 mg | Freq: Once | INTRAVENOUS | Status: AC
Start: 2012-12-29 — End: 2012-12-30
  Administered 2012-12-30: 1000 mg via INTRAVENOUS
  Filled 2012-12-29: qty 200

## 2012-12-29 MED ORDER — SODIUM CHLORIDE 0.9 % IV SOLN
1000.0000 mL | INTRAVENOUS | Status: DC
  Administered 2012-12-29: 1000 mL via INTRAVENOUS

## 2012-12-29 NOTE — ED Provider Notes (Signed)
History     CSN: [id number]  Arrival date & time 12/29/12  1901   First MD Initiated Contact with Patient 12/29/12 2117      Chief Complaint  Patient presents with  . Abdominal Pain    HPI Patient is 9 days status post umbilical hernia repair that was performed laparoscopically with mesh.  Since then she's had some increased abdominal pain over the past 48 hours.  No vomiting or nausea.  No fever.  She does feel like her abdomen is swelling some.  She's had normal bowel movements.  No diarrhea.  No skin changes of her abdomen.  Her pain is moderate in severity at this time.  Nothing improves her pain.  Her pain is worsened by movement and palpation of her abdomen   Past Medical History  Diagnosis Date  . No pertinent past medical history   . Umbilical hernia   . Headache   . Shortness of breath     Past Surgical History  Procedure Laterality Date  . Cesarean section  05/15/2012  . Anal fissure repair  08/16/2009  . Cesarean section  12/03/2010  . Umbilical hernia repair N/A 12/20/2012    Procedure: LAPAROSCOPIC UMBILICAL HERNIA;  Surgeon: Shelly Rubenstein, MD;  Location: [facility];  Service: General;  Laterality: N/A;  . Insertion of mesh N/A 12/20/2012    Procedure: INSERTION OF MESH;  Surgeon: Shelly Rubenstein, MD;  Location: [facility];  Service: General;  Laterality: N/A;    No family history on file.  History  Substance Use Topics  . Smoking status: Never Smoker   . Smokeless tobacco: Not on file  . Alcohol Use: No    OB History   Grav Para Term Preterm Abortions TAB SAB Ect Mult Living   2 2 2       2       Review of Systems  All other systems reviewed and are negative.    Allergies  Review of patient's allergies indicates no known allergies.  Home Medications   Current Outpatient Rx  Name  Route  Sig  Dispense  Refill  . oxyCODONE-acetaminophen (PERCOCET/ROXICET) 5-325 MG per tablet   Oral   Take 1-2 tablets by mouth every 4 (four) hours as needed for  pain.           BP 99/61  Pulse 83  Temp(Src) 97.6 F (36.4 C) (Oral)  Resp 18  Ht 5\' 5"  (1.651 m)  Wt 195 lb 4 oz (88.565 kg)  BMI 32.49 kg/m2  SpO2 99%  LMP 12/19/2012  Physical Exam  Nursing note and vitals reviewed. Constitutional: She is oriented to person, place, and time. She appears well-developed and well-nourished. No distress.  HENT:  Head: Normocephalic and atraumatic.  Eyes: EOM are normal.  Neck: Normal range of motion.  Cardiovascular: Normal rate, regular rhythm and normal heart sounds.   Pulmonary/Chest: Effort normal and breath sounds normal.  Abdominal: Soft. She exhibits no distension.  Laparoscopic port incisions without secondary signs of infection.  Patient with supraumbilical tenderness.   Musculoskeletal: Normal range of motion.  Neurological: She is alert and oriented to person, place, and time.  Skin: Skin is warm and dry.  Psychiatric: She has a normal mood and affect. Judgment normal.    ED Course  Procedures (including critical care time)  Labs Reviewed  COMPREHENSIVE METABOLIC PANEL - Abnormal; Notable for the following:    Albumin 3.3 (*)    Total Bilirubin 0.1 (*)    All  other components within normal limits  URINALYSIS, ROUTINE W REFLEX MICROSCOPIC - Abnormal; Notable for the following:    Hgb urine dipstick SMALL (*)    All other components within normal limits  CBC WITH DIFFERENTIAL  LIPASE, BLOOD  HCG, SERUM, QUALITATIVE  URINE MICROSCOPIC-ADD ON   Ct Abdomen Pelvis W Contrast  12/29/2012   *RADIOLOGY REPORT*  Clinical Data: Abdominal pain.  CT ABDOMEN AND PELVIS WITH CONTRAST  Technique:  Multidetector CT imaging of the abdomen and pelvis was performed following the standard protocol during bolus administration of intravenous contrast.  Contrast: 50mL OMNIPAQUE IOHEXOL 300 MG/ML  SOLN, OMNIPAQUE IOHEXOL 300 MG/ML  SOLN  Comparison: 10/16/2012.  Findings: The lung bases are clear.  The liver is unremarkable.  No focal  hepatic lesions or intrahepatic biliary dilatation.  The gallbladder is normal.  No common bile duct dilatation.  The pancreas is normal.  The spleen is normal in size.  No focal lesions.  The adrenal glands and kidneys are unremarkable.  The stomach, duodenum, small bowel and colon unremarkable.  No inflammatory changes or mass lesions.  The appendix is normal.  The aorta is normal in caliber.  The major branch vessels are normal. No mesenteric or retroperitoneal mass or adenopathy.  The uterus demonstrates an IUD in the endometrial canal.  The ovaries appear normal.  No pelvic mass or adenopathy.  No significant free pelvic fluid collections.  There are surgical changes involving the anterior abdominal wall with fluid and inflammation in the subcutaneous fat and a fluid collection just deep to the anterior abdominal wall musculature. Findings suspicious for abscess and cellulitis.  The fluid collection measures 5.2 x 2.5 cm.  The bony structures are unremarkable.  IMPRESSION: Postoperative changes involving the anterior abdominal wall with the 5.2 x 3.5 cm fluid collection just deep to the anterior abdominal wall muscles in the upper pelvic region.  There is also cellulitis in the subcutaneous fat.   Original Report Authenticated By: Rudie Meyer, M.D.   I personally reviewed the imaging tests through PACS system I reviewed available ER/hospitalization records through the EMR   1. Abdominal wall fluid collections       MDM  Concerning fluid collection or abdomen.  I discussed the case with Dr. Biagio Quint of general surgery who evaluate the CT scan and the patient in the emergency department        Lyanne Co, MD 12/30/12 [phone]

## 2012-12-29 NOTE — ED Notes (Signed)
Per husband pt had hernia surgery and was discharged on 5/12; since then increased abd pain; no nausea or vomiting; no fever; feels like abd swelling; normal bowel movement yesterday

## 2012-12-30 ENCOUNTER — Encounter (HOSPITAL_COMMUNITY): Payer: Self-pay | Admitting: *Deleted

## 2012-12-30 LAB — CBC
MCH: 27.4 pg (ref 26.0–34.0)
MCHC: 31.4 g/dL (ref 30.0–36.0)
Platelets: 231 10*3/uL (ref 150–400)
RDW: 13.2 % (ref 11.5–15.5)

## 2012-12-30 MED ORDER — OXYCODONE-ACETAMINOPHEN 5-325 MG PO TABS
1.0000 | ORAL_TABLET | ORAL | Status: DC | PRN
  Administered 2012-12-30 (×2): 1 via ORAL
  Filled 2012-12-30 (×2): qty 1

## 2012-12-30 MED ORDER — ENOXAPARIN SODIUM 40 MG/0.4ML ~~LOC~~ SOLN
40.0000 mg | SUBCUTANEOUS | Status: DC
  Filled 2012-12-30 (×3): qty 0.4

## 2012-12-30 MED ORDER — SODIUM CHLORIDE 0.9 % IV SOLN
3.0000 g | Freq: Four times a day (QID) | INTRAVENOUS | Status: DC
  Administered 2012-12-30 – 2012-12-31 (×5): 3 g via INTRAVENOUS
  Filled 2012-12-30 (×6): qty 3

## 2012-12-30 MED ORDER — MORPHINE SULFATE 2 MG/ML IJ SOLN
2.0000 mg | INTRAMUSCULAR | Status: DC | PRN
Start: 2012-12-30 — End: 2012-12-31

## 2012-12-30 MED ORDER — ONDANSETRON HCL 4 MG/2ML IJ SOLN: 4.0000 mg | Freq: Four times a day (QID) | INTRAMUSCULAR | Status: DC | PRN

## 2012-12-30 MED ORDER — DIPHENHYDRAMINE HCL 50 MG/ML IJ SOLN
25.0000 mg | Freq: Once | INTRAMUSCULAR | Status: AC
  Administered 2012-12-30: 25 mg via INTRAVENOUS
  Filled 2012-12-30: qty 1

## 2012-12-30 MED ORDER — KCL IN DEXTROSE-NACL 20-5-0.45 MEQ/L-%-% IV SOLN
INTRAVENOUS | Status: DC
  Administered 2012-12-30 (×3): via INTRAVENOUS
  Filled 2012-12-30 (×6): qty 1000

## 2012-12-30 NOTE — H&P (Signed)
Heidi [name] is an 32 y.o. female.  HPI: this patient is known to our service and Dr. Magnus Ivan for a history of an umbilical hernia and laparoscopic umbilical hernia repair with mesh about 10 days ago. She said that she was discharged after 2 day hospital stay and her pain had been improving until about 2 days ago when she had increasing abdominal pain around her umbilical region as well as some swelling in the area. She said that this has gotten worse over the last 2 days and she has some anorexia but otherwise denies any other symptoms such as fevers or chills or nausea or vomiting or any other problems with her bowels  Past Medical History  Diagnosis Date  . No pertinent past medical history   . Umbilical hernia   . Headache   . Shortness of breath     Past Surgical History  Procedure Laterality Date  . Cesarean section  05/15/2012  . Anal fissure repair  08/16/2009  . Cesarean section  12/03/2010  . Umbilical hernia repair N/A 12/20/2012    Procedure: LAPAROSCOPIC UMBILICAL HERNIA;  Surgeon: Shelly Rubenstein, MD;  Location: [facility];  Service: General;  Laterality: N/A;  . Insertion of mesh N/A 12/20/2012    Procedure: INSERTION OF MESH;  Surgeon: Shelly Rubenstein, MD;  Location: [facility];  Service: General;  Laterality: N/A;    No family history on file.  Social History:  reports that she has never smoked. She does not have any smokeless tobacco history on file. She reports that she does not drink alcohol or use illicit drugs.  Allergies: No Known Allergies  Medications: I have reviewed the patient's current medications.  Results for orders placed during the hospital encounter of 12/29/12 (from the past 48 hour(s))  CBC WITH DIFFERENTIAL     Status: None   Collection Time    12/29/12  8:00 PM      Result Value Range   WBC 9.2  4.0 - 10.5 K/uL   RBC 4.77  3.87 - 5.11 MIL/uL   Hemoglobin 13.4  12.0 - 15.0 g/dL   HCT 16.1  09.6 - 04.5 %   MCV 85.7  78.0 - 100.0 fL   MCH  28.1  26.0 - 34.0 pg   MCHC 32.8  30.0 - 36.0 g/dL   RDW 40.9  81.1 - 91.4 %   Platelets 258  150 - 400 K/uL   Neutrophils Relative % 54  43 - 77 %   Neutro Abs 5.0  1.7 - 7.7 K/uL   Lymphocytes Relative 34  12 - 46 %   Lymphs Abs 3.1  0.7 - 4.0 K/uL   Monocytes Relative 10  3 - 12 %   Monocytes Absolute 0.9  0.1 - 1.0 K/uL   Eosinophils Relative 2  0 - 5 %   Eosinophils Absolute 0.2  0.0 - 0.7 K/uL   Basophils Relative 1  0 - 1 %   Basophils Absolute 0.1  0.0 - 0.1 K/uL  COMPREHENSIVE METABOLIC PANEL     Status: Abnormal   Collection Time    12/29/12  8:00 PM      Result Value Range   Sodium 137  135 - 145 mEq/L   Potassium 4.4  3.5 - 5.1 mEq/L   Chloride 104  96 - 112 mEq/L   CO2 25  19 - 32 mEq/L   Glucose, Bld 84  70 - 99 mg/dL   BUN 14  6 -  23 mg/dL   Creatinine, Ser 1.47  0.50 - 1.10 mg/dL   Calcium 9.1  8.4 - 82.9 mg/dL   Total Protein 7.0  6.0 - 8.3 g/dL   Albumin 3.3 (*) 3.5 - 5.2 g/dL   AST 18  0 - 37 U/L   ALT 21  0 - 35 U/L   Alkaline Phosphatase 113  39 - 117 U/L   Total Bilirubin 0.1 (*) 0.3 - 1.2 mg/dL   GFR calc non Af Amer >90  >90 mL/min   GFR calc Af Amer >90  >90 mL/min   Comment:            The eGFR has been calculated     using the CKD EPI equation.     This calculation has not been     validated in all clinical     situations.     eGFR's persistently     <90 mL/min signify     possible Chronic Kidney Disease.  LIPASE, BLOOD     Status: None   Collection Time    12/29/12  8:00 PM      Result Value Range   Lipase 39  11 - 59 U/L  HCG, SERUM, QUALITATIVE     Status: None   Collection Time    12/29/12  8:00 PM      Result Value Range   Preg, Serum NEGATIVE  NEGATIVE   Comment:            THE SENSITIVITY OF THIS     METHODOLOGY IS >10 mIU/mL.  URINALYSIS, ROUTINE W REFLEX MICROSCOPIC     Status: Abnormal   Collection Time    12/29/12  9:28 PM      Result Value Range   Color, Urine YELLOW  YELLOW   APPearance CLEAR  CLEAR   Specific  Gravity, Urine 1.021  1.005 - 1.030   pH 6.5  5.0 - 8.0   Glucose, UA NEGATIVE  NEGATIVE mg/dL   Hgb urine dipstick SMALL (*) NEGATIVE   Bilirubin Urine NEGATIVE  NEGATIVE   Ketones, ur NEGATIVE  NEGATIVE mg/dL   Protein, ur NEGATIVE  NEGATIVE mg/dL   Urobilinogen, UA 0.2  0.0 - 1.0 mg/dL   Nitrite NEGATIVE  NEGATIVE   Leukocytes, UA NEGATIVE  NEGATIVE  URINE MICROSCOPIC-ADD ON     Status: None   Collection Time    12/29/12  9:28 PM      Result Value Range   Squamous Epithelial / LPF RARE  RARE   WBC, UA 0-2  <3 WBC/hpf   RBC / HPF 0-2  <3 RBC/hpf   Bacteria, UA RARE  RARE    Ct Abdomen Pelvis W Contrast  12/29/2012   *RADIOLOGY REPORT*  Clinical Data: Abdominal pain.  CT ABDOMEN AND PELVIS WITH CONTRAST  Technique:  Multidetector CT imaging of the abdomen and pelvis was performed following the standard protocol during bolus administration of intravenous contrast.  Contrast: 50mL OMNIPAQUE IOHEXOL 300 MG/ML  SOLN, OMNIPAQUE IOHEXOL 300 MG/ML  SOLN  Comparison: 10/16/2012.  Findings: The lung bases are clear.  The liver is unremarkable.  No focal hepatic lesions or intrahepatic biliary dilatation.  The gallbladder is normal.  No common bile duct dilatation.  The pancreas is normal.  The spleen is normal in size.  No focal lesions.  The adrenal glands and kidneys are unremarkable.  The stomach, duodenum, small bowel and colon unremarkable.  No inflammatory changes or mass lesions.  The appendix is  normal.  The aorta is normal in caliber.  The major branch vessels are normal. No mesenteric or retroperitoneal mass or adenopathy.  The uterus demonstrates an IUD in the endometrial canal.  The ovaries appear normal.  No pelvic mass or adenopathy.  No significant free pelvic fluid collections.  There are surgical changes involving the anterior abdominal wall with fluid and inflammation in the subcutaneous fat and a fluid collection just deep to the anterior abdominal wall musculature. Findings  suspicious for abscess and cellulitis.  The fluid collection measures 5.2 x 2.5 cm.  The bony structures are unremarkable.  IMPRESSION: Postoperative changes involving the anterior abdominal wall with the 5.2 x 3.5 cm fluid collection just deep to the anterior abdominal wall muscles in the upper pelvic region.  There is also cellulitis in the subcutaneous fat.   Original Report Authenticated By: Rudie Meyer, M.D.    @ROS @ Blood pressure 99/61, pulse 83, temperature 97.6 F (36.4 C), temperature source Oral, resp. rate 18, height 5\' 5"  (1.651 m), weight 195 lb 4 oz (88.565 kg), last menstrual period 12/19/2012, SpO2 99.00%. General appearance: alert, cooperative and no distress Resp: clear to auscultation bilaterally Cardio: regular rate and rhythm, S1, S2 normal, no murmur, click, rub or gallop GI: soft, focal tenderness in midline just above the umbilicus with a palpable mass like effect or fluid collection, she does not have peritonitis and there is no overlying skin changes or erythema or induration Extremities: extremities normal, atraumatic, no cyanosis or edema Neurologic: Grossly normal  Assessment/Plan: Status post laparoscopic umbilical hernia repair with mesh with abdominal pain She appears to have a postoperative seroma or possible abscess involving her umbilical hernia mesh. She is afebrile and her white blood count is normal and she has no evidence of erythema or induration or other sign of infection. This could be a noninfected seroma but this could also be a mesh infection. I agree with broad-spectrum antibiotics and we will plan on admitting her for further workup and possible aspiration and culture of the fluid. I explained to the patient that if this is an abscess or mesh infection, she will likely need explantation of her mesh. I will admit her for IV antibiotics and further workup by her surgeon  Lodema Pilot DAVID 12/30/2012, 1:05 AM

## 2012-12-30 NOTE — Progress Notes (Signed)
<principal problem not specified>  Assessment: Subjectively improved  Plan: Will allow food today and re-evaluate in the AM. May need a drain.   Subjective: Feels better, lwess pain than this am. hungry  Objective: Vital signs in last 24 hours: Temp:  [97.6 F (36.4 C)-98.7 F (37.1 C)] 97.6 F (36.4 C) (05/22 0600) Pulse Rate:  [72-89] 72 (05/22 0600) Resp:  [18-20] 20 (05/22 0600) BP: (91-116)/(52-67) 91/52 mmHg (05/22 0600) SpO2:  [96 %-100 %] 99 % (05/22 0600) Weight:  [194 lb 14.2 oz (88.4 kg)-195 lb 4 oz (88.565 kg)] 194 lb 14.2 oz (88.4 kg) (05/22 0245) Last BM Date: 12/28/12  Intake/Output from previous day: 05/21 0701 - 05/22 0700 In: 1372.9 [I.V.:1372.9] Out: -   General appearance: alert, cooperative and no distress GI: Incisions all clean, mild tenderness around area of umbilicus, no obviouls drainable collection  Lab Results:  Results for orders placed during the hospital encounter of 12/29/12 (from the past 24 hour(s))  CBC WITH DIFFERENTIAL     Status: None   Collection Time    12/29/12  8:00 PM      Result Value Range   WBC 9.2  4.0 - 10.5 K/uL   RBC 4.77  3.87 - 5.11 MIL/uL   Hemoglobin 13.4  12.0 - 15.0 g/dL   HCT 16.1  09.6 - 04.5 %   MCV 85.7  78.0 - 100.0 fL   MCH 28.1  26.0 - 34.0 pg   MCHC 32.8  30.0 - 36.0 g/dL   RDW 40.9  81.1 - 91.4 %   Platelets 258  150 - 400 K/uL   Neutrophils Relative % 54  43 - 77 %   Neutro Abs 5.0  1.7 - 7.7 K/uL   Lymphocytes Relative 34  12 - 46 %   Lymphs Abs 3.1  0.7 - 4.0 K/uL   Monocytes Relative 10  3 - 12 %   Monocytes Absolute 0.9  0.1 - 1.0 K/uL   Eosinophils Relative 2  0 - 5 %   Eosinophils Absolute 0.2  0.0 - 0.7 K/uL   Basophils Relative 1  0 - 1 %   Basophils Absolute 0.1  0.0 - 0.1 K/uL  COMPREHENSIVE METABOLIC PANEL     Status: Abnormal   Collection Time    12/29/12  8:00 PM      Result Value Range   Sodium 137  135 - 145 mEq/L   Potassium 4.4  3.5 - 5.1 mEq/L   Chloride 104  96 - 112  mEq/L   CO2 25  19 - 32 mEq/L   Glucose, Bld 84  70 - 99 mg/dL   BUN 14  6 - 23 mg/dL   Creatinine, Ser 7.82  0.50 - 1.10 mg/dL   Calcium 9.1  8.4 - 95.6 mg/dL   Total Protein 7.0  6.0 - 8.3 g/dL   Albumin 3.3 (*) 3.5 - 5.2 g/dL   AST 18  0 - 37 U/L   ALT 21  0 - 35 U/L   Alkaline Phosphatase 113  39 - 117 U/L   Total Bilirubin 0.1 (*) 0.3 - 1.2 mg/dL   GFR calc non Af Amer >90  >90 mL/min   GFR calc Af Amer >90  >90 mL/min  LIPASE, BLOOD     Status: None   Collection Time    12/29/12  8:00 PM      Result Value Range   Lipase 39  11 - 59 U/L  HCG, SERUM,  QUALITATIVE     Status: None   Collection Time    12/29/12  8:00 PM      Result Value Range   Preg, Serum NEGATIVE  NEGATIVE  URINALYSIS, ROUTINE W REFLEX MICROSCOPIC     Status: Abnormal   Collection Time    12/29/12  9:28 PM      Result Value Range   Color, Urine YELLOW  YELLOW   APPearance CLEAR  CLEAR   Specific Gravity, Urine 1.021  1.005 - 1.030   pH 6.5  5.0 - 8.0   Glucose, UA NEGATIVE  NEGATIVE mg/dL   Hgb urine dipstick SMALL (*) NEGATIVE   Bilirubin Urine NEGATIVE  NEGATIVE   Ketones, ur NEGATIVE  NEGATIVE mg/dL   Protein, ur NEGATIVE  NEGATIVE mg/dL   Urobilinogen, UA 0.2  0.0 - 1.0 mg/dL   Nitrite NEGATIVE  NEGATIVE   Leukocytes, UA NEGATIVE  NEGATIVE  URINE MICROSCOPIC-ADD ON     Status: None   Collection Time    12/29/12  9:28 PM      Result Value Range   Squamous Epithelial / LPF RARE  RARE   WBC, UA 0-2  <3 WBC/hpf   RBC / HPF 0-2  <3 RBC/hpf   Bacteria, UA RARE  RARE  CBC     Status: None   Collection Time    12/30/12  4:27 AM      Result Value Range   WBC 7.9  4.0 - 10.5 K/uL   RBC 4.67  3.87 - 5.11 MIL/uL   Hemoglobin 12.8  12.0 - 15.0 g/dL   HCT 16.1  09.6 - 04.5 %   MCV 87.2  78.0 - 100.0 fL   MCH 27.4  26.0 - 34.0 pg   MCHC 31.4  30.0 - 36.0 g/dL   RDW 40.9  81.1 - 91.4 %   Platelets 231  150 - 400 K/uL     Studies/Results Radiology     MEDS, Scheduled .  ampicillin-sulbactam (UNASYN) IV  3 g Intravenous Q6H  . enoxaparin (LOVENOX) injection  40 mg Subcutaneous Q24H       LOS: 1 day    Currie Paris, MD, [facility], [place] [phone]   12/30/2012 1:47 PM

## 2012-12-30 NOTE — Progress Notes (Signed)
INITIAL NUTRITION ASSESSMENT  DOCUMENTATION CODES Per approved criteria  -Obesity Unspecified   INTERVENTION: - Diet advancement per MD - Will continue to monitor   NUTRITION DIAGNOSIS: Inadequate oral intake related to inability to eat as evidenced by NPO.   Goal: Advance diet as tolerated to regula diet  Monitor:  Weights, labs, diet advancement, nausea/vomiting  Reason for Assessment: Nutrition risk   32 y.o. female  Admitting Dx: Possible abscess or mesh infection from recent umbilical hernia repair  ASSESSMENT: Pt had recent umbilical hernia repair with insertion of mesh on 12/20/12. Pt d/c 12/22/12 and reported since then having increased abdominal pain that felt like abdominal swelling.   Met with pt who reports eating 2 meals/day of vegetarian diet at home. Pt reports 3 pound intentional weight loss recently as she was trying to lose weight. Pt reports her abdominal pain has resolved and denied any vomiting yesterday or today.   Height: Ht Readings from Last 1 Encounters:  12/30/12 5\' 5"  (1.651 m)    Weight: Wt Readings from Last 1 Encounters:  12/30/12 194 lb 14.2 oz (88.4 kg)    Ideal Body Weight: 125 lb  % Ideal Body Weight: 155  Wt Readings from Last 10 Encounters:  12/30/12 194 lb 14.2 oz (88.4 kg)  12/20/12 180 lb (81.647 kg)  12/20/12 180 lb (81.647 kg)  11/08/12 193 lb (87.544 kg)  08/16/12 190 lb 12.8 oz (86.546 kg)  11/26/11 185 lb 2 oz (83.972 kg)    Usual Body Weight: 197 lb per pt  % Usual Body Weight: 98  BMI:  Body mass index is 32.43 kg/(m^2). Class I obesity   Estimated Nutritional Needs: Kcal: 1400-1650 Protein: 65-80g Fluid: 1.4-1.6L/day  Skin: Mid and lower abdominal incision  Diet Order: General  EDUCATION NEEDS: -No education needs identified at this time   Intake/Output Summary (Last 24 hours) at 12/30/12 1356 Last data filed at 12/30/12 1300  Gross per 24 hour  Intake 1492.92 ml  Output    500 ml  Net 992.92 ml     Last BM: 5/20  Labs:   Recent Labs Lab 12/29/12 2000  NA 137  K 4.4  CL 104  CO2 25  BUN 14  CREATININE 0.55  CALCIUM 9.1  GLUCOSE 84    CBG (last 3)  No results found for this basename: GLUCAP,  in the last 72 hours  Scheduled Meds: . ampicillin-sulbactam (UNASYN) IV  3 g Intravenous Q6H  . enoxaparin (LOVENOX) injection  40 mg Subcutaneous Q24H    Continuous Infusions: . dextrose 5 % and 0.45 % NaCl with KCl 20 mEq/L 125 mL/hr at 12/30/12 1204    Past Medical History  Diagnosis Date  . No pertinent past medical history   . Umbilical hernia   . Headache   . Shortness of breath     Past Surgical History  Procedure Laterality Date  . Cesarean section  05/15/2012  . Anal fissure repair  08/16/2009  . Cesarean section  12/03/2010  . Umbilical hernia repair N/A 12/20/2012    Procedure: LAPAROSCOPIC UMBILICAL HERNIA;  Surgeon: Shelly Rubenstein, MD;  Location: [facility];  Service: General;  Laterality: N/A;  . Insertion of mesh N/A 12/20/2012    Procedure: INSERTION OF MESH;  Surgeon: Shelly Rubenstein, MD;  Location: [facility];  Service: General;  Laterality: N/A;     Levon Hedger MS, RD, LDN [phone] Pager [phone] After Hours Pager

## 2012-12-31 ENCOUNTER — Encounter (INDEPENDENT_AMBULATORY_CARE_PROVIDER_SITE_OTHER): Payer: Self-pay | Admitting: Surgery

## 2012-12-31 ENCOUNTER — Ambulatory Visit (INDEPENDENT_AMBULATORY_CARE_PROVIDER_SITE_OTHER): Payer: Self-pay | Admitting: Surgery

## 2012-12-31 VITALS — BP 125/81 | HR 68 | Temp 98.0°F | Resp 14 | Ht 65.0 in | Wt 196.2 lb

## 2012-12-31 DIAGNOSIS — Z09 Encounter for follow-up examination after completed treatment for conditions other than malignant neoplasm: Secondary | ICD-10-CM

## 2012-12-31 LAB — CBC
MCV: 87.7 fL (ref 78.0–100.0)
Platelets: 254 10*3/uL (ref 150–400)
RDW: 13 % (ref 11.5–15.5)
WBC: 7.2 10*3/uL (ref 4.0–10.5)

## 2012-12-31 MED ORDER — OXYCODONE-ACETAMINOPHEN 10-325 MG PO TABS: 1.0000 | ORAL_TABLET | ORAL | Status: DC | PRN

## 2012-12-31 MED ORDER — DOXYCYCLINE HYCLATE 100 MG PO TABS: 100.0000 mg | ORAL_TABLET | Freq: Two times a day (BID) | ORAL | Status: DC

## 2012-12-31 NOTE — Discharge Instructions (Signed)
Come to office at 2pm today

## 2012-12-31 NOTE — Progress Notes (Signed)
Subjective:     Patient ID: Heidi [name], female   DOB: [birth year], 32 y.o.   MRN: [id number]  HPI  She is here for a followup from the hospital. She is having much less abdominal discomfort Review of Systems     Objective:   Physical Exam On exam, her abdomen is soft. To deep palpation, I could feel the seroma    Assessment:     Patient stable postop     Plan:     Again, as she is minimally symptomatic now I would like to hold on an attempt to drain the stroma for fear of infecting the mesh. She is in total agreement. I will see her back next week

## 2012-12-31 NOTE — Progress Notes (Signed)
Patient ID: Heidi [name], female   DOB: [birth year], 32 y.o.   MRN: [id number]  She is feeling much better Pain well controlled No fevers  On exam, her abdomen is pretty benign, minimally tender  A/P:  Her abdominal wall is extremely thin which I suspect is why she hurts more.  I do not think the fluid collection is infected.  I explained to her and her husband that most patients develop fluid in the hernia sac or around the message post op.  Will discharge home and have her followup in my office later today.

## 2012-12-31 NOTE — Discharge Summary (Signed)
Physician Discharge Summary  Patient ID: Heidi [name] MRN: [id number] DOB/AGE: [birth year] 31 y.o.  Admit date: 12/29/2012 Discharge date: 12/31/2012  Admission Diagnoses:  Discharge Diagnoses:  Active Problems:   * No active hospital problems. * postop pain and seroma  Discharged Condition: good  Hospital Course: admitted for postop pain.  Found on CT to have seroma.  Pain improved.  Discharged home.  Consults: None  Significant Diagnostic Studies:   Treatments:   Discharge Exam: Blood pressure 93/68, pulse 79, temperature 97.4 F (36.3 C), temperature source Oral, resp. rate 16, height 5\' 5"  (1.651 m), weight 194 lb 14.2 oz (88.4 kg), last menstrual period 12/19/2012, SpO2 100.00%. General appearance: alert, cooperative and no distress Resp: clear to auscultation bilaterally Cardio: regular rate and rhythm, S1, S2 normal, no murmur, click, rub or gallop Incision/Wound: abdomen soft, no erythema, minimally tender  Disposition: 01-Home or Self Care   Future Appointments Provider Department Dept Phone   01/05/2013 5:00 PM Shelly Rubenstein, MD [facility], [place] [phone]       Medication List    STOP taking these medications       oxyCODONE-acetaminophen 5-325 MG per tablet  Commonly known as:  PERCOCET/ROXICET      TAKE these medications       doxycycline 100 MG tablet  Commonly known as:  VIBRA-TABS  Take 1 tablet (100 mg total) by mouth 2 (two) times daily.     oxyCODONE-acetaminophen 10-325 MG per tablet  Commonly known as:  PERCOCET  Take 1 tablet by mouth every 4 (four) hours as needed for pain.           Follow-up Information   Follow up with [facility] A, MD. Call on 12/31/2012. (today.  come at 2 o'clock)    Contact information:   [address] [place] [address] [phone]       Signed: Shelly Rubenstein 12/31/2012, 6:42 AM

## 2012-12-31 NOTE — Progress Notes (Signed)
Discharge instructions given to pt, verbalized understanding. Left the unit in stable condition. 

## 2012-12-31 NOTE — Progress Notes (Signed)
Patient with no complaint of SOB or pain at this time. Patient verbalized understanding to remain NPO at this time. Husband remains at the bedside. Will continue to monitor patient per Doctor order and unit protocol.

## 2013-01-05 ENCOUNTER — Ambulatory Visit (INDEPENDENT_AMBULATORY_CARE_PROVIDER_SITE_OTHER): Payer: Self-pay | Admitting: Surgery

## 2013-01-05 ENCOUNTER — Encounter (INDEPENDENT_AMBULATORY_CARE_PROVIDER_SITE_OTHER): Payer: Self-pay | Admitting: Surgery

## 2013-01-05 VITALS — BP 101/60 | HR 70 | Temp 97.7°F | Resp 16 | Ht 65.0 in | Wt 193.8 lb

## 2013-01-05 DIAGNOSIS — Z09 Encounter for follow-up examination after completed treatment for conditions other than malignant neoplasm: Secondary | ICD-10-CM

## 2013-01-05 NOTE — Progress Notes (Signed)
Subjective:     Patient ID: Heidi [name], female   DOB: 05/31/1981, 32 y.o.   MRN: [id number]  HPI She is here for a recheck. Her pain continues to improve. She has no fevers. She denies nausea or vomiting  Review of Systems     Objective:   Physical Exam On exam, she still has an extremely lax abdominal wall. It is minimally tender today and there is no erythema. I can feel no recurrent hernia and cannot palpate a fluid collection today    Assessment:     Patient stable postop     Plan:     She will continue to refrain from heavy lifting. I will see her back in one month

## 2013-01-17 ENCOUNTER — Telehealth (INDEPENDENT_AMBULATORY_CARE_PROVIDER_SITE_OTHER): Payer: Self-pay | Admitting: *Deleted

## 2013-01-17 NOTE — Telephone Encounter (Signed)
Family called to report that patient is having increased pain over the last 6 days along with "abdomen is big".  Family states no vomiting and patient had a bowel movement yesterday.  Magnus Ivan MD paged at this time, awaiting response.

## 2013-01-17 NOTE — Telephone Encounter (Signed)
Spoke to [place] MD who stated that with the families description it sounds as though patient needs CT scan however with the need for pain management as well it was decided to suggest to the patient to go to the ED [facility] or [facility] for an evaluation.  Family states understanding and agreeable at this time.

## 2013-02-09 ENCOUNTER — Encounter (INDEPENDENT_AMBULATORY_CARE_PROVIDER_SITE_OTHER): Payer: Self-pay | Admitting: Surgery

## 2013-03-28 ENCOUNTER — Ambulatory Visit (INDEPENDENT_AMBULATORY_CARE_PROVIDER_SITE_OTHER): Payer: Self-pay | Admitting: Surgery

## 2013-03-28 ENCOUNTER — Encounter (INDEPENDENT_AMBULATORY_CARE_PROVIDER_SITE_OTHER): Payer: Self-pay | Admitting: Surgery

## 2013-03-28 VITALS — BP 122/62 | HR 68 | Temp 97.6°F | Resp 16 | Ht 60.0 in | Wt 193.6 lb

## 2013-03-28 DIAGNOSIS — R198 Other specified symptoms and signs involving the digestive system and abdomen: Secondary | ICD-10-CM

## 2013-03-28 DIAGNOSIS — M629 Disorder of muscle, unspecified: Secondary | ICD-10-CM

## 2013-03-28 NOTE — Progress Notes (Signed)
Subjective:     Patient ID: Heidi [name], female   DOB: October 23, 1980, 32 y.o.   MRN: [id number]  HPI She is status post laparoscopic  Umbilical hernia repair with mesh in May of this past year. 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 She hopes this would have improved her cosmesis. She does have some mild right-sided pain.  She has no obstructive symptoms  Review of Systems     Objective:   Physical Exam On exam, her abdomen is very soft and very lax. There may be a small seroma in the hernia sac there is no evidence of recurrent hernia. Her abdomen is soft and nontender    Assessment:     Abdominal wall laxity     Plan:     Unfortunately, this is mostly cosmetic. She would need an abdominoplasty/tummy tuck by plastic surgeons to remedy this. I discussed this with her. I gave her names of several plastic surgeons. I will see her back as needed

## 2013-06-22 ENCOUNTER — Emergency Department (HOSPITAL_COMMUNITY)
Admission: EM | Admit: 2013-06-22 | Discharge: 2013-06-22 | Disposition: A | Discharge status: Home / Self Care | Payer: Self-pay | Attending: Emergency Medicine | Admitting: Emergency Medicine

## 2013-06-22 ENCOUNTER — Encounter (HOSPITAL_COMMUNITY): Payer: Self-pay | Admitting: Emergency Medicine

## 2013-06-22 ENCOUNTER — Emergency Department (HOSPITAL_COMMUNITY): Payer: Self-pay

## 2013-06-22 DIAGNOSIS — Z3202 Encounter for pregnancy test, result negative: Secondary | ICD-10-CM | POA: Insufficient documentation

## 2013-06-22 DIAGNOSIS — Z8719 Personal history of other diseases of the digestive system: Secondary | ICD-10-CM | POA: Insufficient documentation

## 2013-06-22 DIAGNOSIS — R109 Unspecified abdominal pain: Secondary | ICD-10-CM | POA: Insufficient documentation

## 2013-06-22 LAB — COMPREHENSIVE METABOLIC PANEL
AST: 18 U/L (ref 0–37)
Albumin: 3.6 g/dL (ref 3.5–5.2)
Calcium: 9.1 mg/dL (ref 8.4–10.5)
Chloride: 105 mEq/L (ref 96–112)
Creatinine, Ser: 0.47 mg/dL — ABNORMAL LOW (ref 0.50–1.10)
GFR calc non Af Amer: >90 mL/min (ref >90)
Total Bilirubin: 0.1 mg/dL — ABNORMAL LOW (ref 0.3–1.2)
Total Protein: 7.2 g/dL (ref 6.0–8.3)

## 2013-06-22 LAB — CBC WITH DIFFERENTIAL/PLATELET
Basophils Absolute: 0.1 10*3/uL (ref 0.0–0.1)
Basophils Relative: 1 % (ref 0–1)
Eosinophils Absolute: 0 10*3/uL (ref 0.0–0.7)
Eosinophils Relative: 0 % (ref 0–5)
HCT: 43.2 % (ref 36.0–46.0)
Lymphocytes Relative: 34 % (ref 12–46)
MCH: 28.5 pg (ref 26.0–34.0)
MCHC: 32.9 g/dL (ref 30.0–36.0)
MCV: 86.6 fL (ref 78.0–100.0)
Monocytes Absolute: 0.7 10*3/uL (ref 0.1–1.0)
Monocytes Relative: 8 % (ref 3–12)
Platelets: 201 10*3/uL (ref 150–400)
RDW: 13 % (ref 11.5–15.5)

## 2013-06-22 LAB — URINALYSIS, ROUTINE W REFLEX MICROSCOPIC
Glucose, UA: NEGATIVE mg/dL
Protein, ur: NEGATIVE mg/dL
Specific Gravity, Urine: 1.008 (ref 1.005–1.030)
pH: 7.5 (ref 5.0–8.0)

## 2013-06-22 LAB — PREGNANCY, URINE: Preg Test, Ur: NEGATIVE

## 2013-06-22 LAB — URINE MICROSCOPIC-ADD ON

## 2013-06-22 LAB — LIPASE, BLOOD: Lipase: 33 U/L (ref 11–59)

## 2013-06-22 MED ORDER — SODIUM CHLORIDE 0.9 % IV SOLN
1000.0000 mL | Freq: Once | INTRAVENOUS | Status: AC
  Administered 2013-06-22: 1000 mL via INTRAVENOUS

## 2013-06-22 MED ORDER — IOHEXOL 300 MG/ML  SOLN
100.0000 mL | Freq: Once | INTRAMUSCULAR | Status: AC | PRN
  Administered 2013-06-22: 100 mL via INTRAVENOUS

## 2013-06-22 MED ORDER — HYDROMORPHONE HCL PF 1 MG/ML IJ SOLN
1.0000 mg | INTRAMUSCULAR | Status: DC | PRN
  Administered 2013-06-22: 1 mg via INTRAVENOUS
  Filled 2013-06-22: qty 1

## 2013-06-22 MED ORDER — ONDANSETRON HCL 4 MG/2ML IJ SOLN
4.0000 mg | Freq: Once | INTRAMUSCULAR | Status: AC
  Administered 2013-06-22: 4 mg via INTRAVENOUS
  Filled 2013-06-22: qty 2

## 2013-06-22 MED ORDER — HYDROCODONE-ACETAMINOPHEN 5-325 MG PO TABS: 1.0000 | ORAL_TABLET | Freq: Four times a day (QID) | ORAL | Status: DC | PRN

## 2013-06-22 MED ORDER — IOHEXOL 300 MG/ML  SOLN
50.0000 mL | Freq: Once | INTRAMUSCULAR | Status: AC | PRN
  Administered 2013-06-22: 50 mL via ORAL

## 2013-06-22 NOTE — ED Provider Notes (Signed)
CSN: [id number]     Arrival date & time 06/22/13  1801 History   First MD Initiated Contact with Patient 06/22/13 1817     Chief Complaint  Patient presents with  . Abdominal Pain   (Consider location/radiation/quality/duration/timing/severity/associated sxs/prior Treatment) HPI Patient presents with increasing abdominal pain and bulging of her upper abdomen.  Patient has a notable history of hernia repair 6 months ago.  She states that since that repair she has had pain persistently in the upper abdomen.  She states that over the past days the pain has increased, with increasing bulging in the affected area. There is no relief with OTC medication. There is no nuchal vomiting, diarrhea, nausea. No fevers, no chills.  Past Medical History  Diagnosis Date  . No pertinent past medical history   . Umbilical hernia   . Headache(784.0)   . Shortness of breath    Past Surgical History  Procedure Laterality Date  . Cesarean section  05/15/2012  . Anal fissure repair  08/16/2009  . Cesarean section  12/03/2010  . Umbilical hernia repair N/A 12/20/2012    Procedure: LAPAROSCOPIC UMBILICAL HERNIA;  Surgeon: Shelly Rubenstein, MD;  Location: [facility];  Service: General;  Laterality: N/A;  . Insertion of mesh N/A 12/20/2012    Procedure: INSERTION OF MESH;  Surgeon: Shelly Rubenstein, MD;  Location: [facility];  Service: General;  Laterality: N/A;  . Hernia repair     No family history on file. History  Substance Use Topics  . Smoking status: Never Smoker   . Smokeless tobacco: Not on file  . Alcohol Use: No   OB History   Grav Para Term Preterm Abortions TAB SAB Ect Mult Living   2 2 2       2      Review of Systems  Constitutional:       Per HPI, otherwise negative  HENT:       Per HPI, otherwise negative  Respiratory:       Per HPI, otherwise negative  Cardiovascular:       Per HPI, otherwise negative  Gastrointestinal: Negative for vomiting.  Endocrine:       Negative aside from  HPI  Genitourinary:       Neg aside from HPI   Musculoskeletal:       Per HPI, otherwise negative  Skin: Negative.   Neurological: Negative for syncope.    Allergies  Vancomycin  Home Medications   Current Outpatient Rx  Name  Route  Sig  Dispense  Refill  . acetaminophen (TYLENOL) 500 MG tablet   Oral   Take 1,000 mg by mouth every 6 (six) hours as needed.          BP 106/75  Pulse 82  Temp(Src) 98.1 F (36.7 C) (Oral)  Resp 16  SpO2 97% Physical Exam  Nursing note and vitals reviewed. Constitutional: She is oriented to person, place, and time. She appears well-developed and well-nourished. No distress.  HENT:  Head: Normocephalic and atraumatic.  Eyes: Conjunctivae and EOM are normal.  Cardiovascular: Normal rate and regular rhythm.   Pulmonary/Chest: Effort normal and breath sounds normal. No stridor. No respiratory distress.  Abdominal: She exhibits no distension.    Musculoskeletal: She exhibits no edema.  Neurological: She is alert and oriented to person, place, and time. No cranial nerve deficit.  Skin: Skin is warm and dry.  Psychiatric: She has a normal mood and affect.    ED Course  Procedures (including critical  care time) Labs Review Labs Reviewed  CBC WITH DIFFERENTIAL  COMPREHENSIVE METABOLIC PANEL  LIPASE, BLOOD  URINALYSIS, ROUTINE W REFLEX MICROSCOPIC  PREGNANCY, URINE   Imaging Review No results found.  EKG Interpretation   None      After the initial evaluation I reviewed the patient's chart, including CT scan performed several weeks after her surgery.  Update: On repeat exam the patient appears comfortable.  We reviewed the CT images together, with her family members.  No notable findings MDM   1. Abdominal pain     this female presents with o size and abdominal lesion, concerning for possible postsurgical complication.  Patient's evaluation, including labs, CT scan were largely reassuring today.  Absent fever, distress, she  was discharged with analgesics, resources to assist with finding a primary care physician.ngoing abdominal pain, and increasing    Gerhard Munch, MD 06/23/13 0013

## 2013-06-22 NOTE — ED Notes (Signed)
Pt c/o upper abdominal pain x56months, states had a hernia repair with mesh by Dr. Rayburn Ma 5/14, he states its fine. Pt c/o pain every day

## 2013-10-18 ENCOUNTER — Inpatient Hospital Stay (HOSPITAL_COMMUNITY): Payer: Self-pay

## 2013-10-18 ENCOUNTER — Inpatient Hospital Stay (HOSPITAL_COMMUNITY)
Admission: AD | Admit: 2013-10-18 | Discharge: 2013-10-18 | Disposition: A | Discharge status: Home / Self Care | Payer: Self-pay | Source: Ambulatory Visit | Attending: Obstetrics & Gynecology | Admitting: Obstetrics & Gynecology

## 2013-10-18 ENCOUNTER — Encounter (HOSPITAL_COMMUNITY): Payer: Self-pay | Admitting: *Deleted

## 2013-10-18 DIAGNOSIS — T8332XA Displacement of intrauterine contraceptive device, initial encounter: Secondary | ICD-10-CM

## 2013-10-18 DIAGNOSIS — N949 Unspecified condition associated with female genital organs and menstrual cycle: Secondary | ICD-10-CM | POA: Insufficient documentation

## 2013-10-18 DIAGNOSIS — R109 Unspecified abdominal pain: Secondary | ICD-10-CM | POA: Insufficient documentation

## 2013-10-18 DIAGNOSIS — Z30431 Encounter for routine checking of intrauterine contraceptive device: Secondary | ICD-10-CM | POA: Insufficient documentation

## 2013-10-18 DIAGNOSIS — B373 Candidiasis of vulva and vagina: Secondary | ICD-10-CM

## 2013-10-18 DIAGNOSIS — B3731 Acute candidiasis of vulva and vagina: Secondary | ICD-10-CM | POA: Insufficient documentation

## 2013-10-18 LAB — URINALYSIS, ROUTINE W REFLEX MICROSCOPIC
Bilirubin Urine: NEGATIVE
GLUCOSE, UA: NEGATIVE mg/dL
Hgb urine dipstick: NEGATIVE
Ketones, ur: NEGATIVE mg/dL
LEUKOCYTES UA: NEGATIVE
Nitrite: NEGATIVE
Protein, ur: NEGATIVE mg/dL
Specific Gravity, Urine: 1.015 (ref 1.005–1.030)
Urobilinogen, UA: 0.2 mg/dL (ref 0.0–1.0)
pH: 7.5 (ref 5.0–8.0)

## 2013-10-18 LAB — CBC
HCT: 42.1 % (ref 36.0–46.0)
HEMOGLOBIN: 14.1 g/dL (ref 12.0–15.0)
MCH: 29 pg (ref 26.0–34.0)
MCHC: 33.5 g/dL (ref 30.0–36.0)
MCV: 86.4 fL (ref 78.0–100.0)
Platelets: 198 10*3/uL (ref 150–400)
RBC: 4.87 MIL/uL (ref 3.87–5.11)
RDW: 12.8 % (ref 11.5–15.5)
WBC: 10.5 10*3/uL (ref 4.0–10.5)

## 2013-10-18 LAB — WET PREP, GENITAL
Clue Cells Wet Prep HPF POC: NONE SEEN
Trich, Wet Prep: NONE SEEN

## 2013-10-18 LAB — POCT PREGNANCY, URINE: Preg Test, Ur: NEGATIVE

## 2013-10-18 MED ORDER — FLUCONAZOLE 150 MG PO TABS: 150.0000 mg | ORAL_TABLET | Freq: Once | ORAL | Status: DC

## 2013-10-18 MED ORDER — IBUPROFEN 600 MG PO TABS: 600.0000 mg | ORAL_TABLET | Freq: Four times a day (QID) | ORAL | Status: DC | PRN

## 2013-10-18 NOTE — Discharge Instructions (Signed)
Candida Infection, Adult A candida infection (also called yeast, fungus and Monilia infection) is an overgrowth of yeast that can occur anywhere on the body. A yeast infection commonly occurs in warm, moist body areas. Usually, the infection remains localized but can spread to become a systemic infection. A yeast infection may be a sign of a more severe disease such as diabetes, leukemia, or AIDS. A yeast infection can occur in both men and women. In women, Candida vaginitis is a vaginal infection. It is one of the most common causes of vaginitis. Men usually do not have symptoms or know they have an infection until other problems develop. Men may find out they have a yeast infection because their sex partner has a yeast infection. Uncircumcised men are more likely to get a yeast infection than circumcised men. This is because the uncircumcised glans is not exposed to air and does not remain as dry as that of a circumcised glans. Older adults may develop yeast infections around dentures. CAUSES  Women  Antibiotics.  Steroid medication taken for a long time.  Being overweight (obese).  Diabetes.  Poor immune condition.  Certain serious medical conditions.  Immune suppressive medications for organ transplant patients.  Chemotherapy.  Pregnancy.  Menstration.  Stress and fatigue.  Intravenous drug use.  Oral contraceptives.  Wearing tight-fitting clothes in the crotch area.  Catching it from a sex partner who has a yeast infection.  Spermicide.  Intravenous, urinary, or other catheters. Men  Catching it from a sex partner who has a yeast infection.  Having oral or anal sex with a person who has the infection.  Spermicide.  Diabetes.  Antibiotics.  Poor immune system.  Medications that suppress the immune system.  Intravenous drug use.  Intravenous, urinary, or other catheters. SYMPTOMS  Women  Thick, white vaginal discharge.  Vaginal itching.  Redness and  swelling in and around the vagina.  Irritation of the lips of the vagina and perineum.  Blisters on the vaginal lips and perineum.  Painful sexual intercourse.  Low blood sugar (hypoglycemia).  Painful urination.  Bladder infections.  Intestinal problems such as constipation, indigestion, bad breath, bloating, increase in gas, diarrhea, or loose stools. Men  Men may develop intestinal problems such as constipation, indigestion, bad breath, bloating, increase in gas, diarrhea, or loose stools.  Dry, cracked skin on the penis with itching or discomfort.  Jock itch.  Dry, flaky skin.  Athlete's foot.  Hypoglycemia. DIAGNOSIS  Women  A history and an exam are performed.  The discharge may be examined under a microscope.  A culture may be taken of the discharge. Men  A history and an exam are performed.  Any discharge from the penis or areas of cracked skin will be looked at under the microscope and cultured.  Stool samples may be cultured. TREATMENT  Women  Vaginal antifungal suppositories and creams.  Medicated creams to decrease irritation and itching on the outside of the vagina.  Warm compresses to the perineal area to decrease swelling and discomfort.  Oral antifungal medications.  Medicated vaginal suppositories or cream for repeated or recurrent infections.  Wash and dry the irritation areas before applying the cream.  Eating yogurt with lactobacillus may help with prevention and treatment.  Sometimes painting the vagina with gentian violet solution may help if creams and suppositories do not work. Men  Antifungal creams and oral antifungal medications.  Sometimes treatment must continue for 30 days after the symptoms go away to prevent recurrence. HOME CARE   INSTRUCTIONS  Women  Use cotton underwear and avoid tight-fitting clothing.  Avoid colored, scented toilet paper and deodorant tampons or pads.  Do not douche.  Keep your diabetes  under control.  Finish all the prescribed medications.  Keep your skin clean and dry.  Consume milk or yogurt with lactobacillus active culture regularly. If you get frequent yeast infections and think that is what the infection is, there are over-the-counter medications that you can get. If the infection does not show healing in 3 days, talk to your caregiver.  Tell your sex partner you have a yeast infection. Your partner may need treatment also, especially if your infection does not clear up or recurs. Men  Keep your skin clean and dry.  Keep your diabetes under control.  Finish all prescribed medications.  Tell your sex partner that you have a yeast infection so they can be treated if necessary. SEEK MEDICAL CARE IF:   Your symptoms do not clear up or worsen in one week after treatment.  You have an oral temperature above 102 F (38.9 C).  You have trouble swallowing or eating for a prolonged time.  You develop blisters on and around your vagina.  You develop vaginal bleeding and it is not your menstrual period.  You develop abdominal pain.  You develop intestinal problems as mentioned above.  You get weak or lightheaded.  You have painful or increased urination.  You have pain during sexual intercourse. MAKE SURE YOU:   Understand these instructions.  Will watch your condition.  Will get help right away if you are not doing well or get worse. Document Released: 09/04/2004 Document Revised: 10/20/2011 Document Reviewed: 12/17/2009 [facility] Patient Information 2014 [place], [place]. Intrauterine Device Insertion, Care After Refer to this sheet in the next few weeks. These instructions provide you with information on caring for yourself after your procedure. Your health care provider may also give you more specific instructions. Your treatment has been planned according to current medical practices, but problems sometimes occur. Call your health care provider if you  have any problems or questions after your procedure. WHAT TO EXPECT AFTER THE PROCEDURE Insertion of the IUD may cause some discomfort, such as cramping. The cramping should improve after the IUD is in place. You may have bleeding after the procedure. This is normal. It varies from light spotting for a few days to menstrual-like bleeding. When the IUD is in place, a string will extend past the cervix into the vagina for 1 2 inches. The strings should not bother you or your partner. If they do, talk to your health care provider.  HOME CARE INSTRUCTIONS   Check your intrauterine device (IUD) to make sure it is in place before you resume sexual activity. You should be able to feel the strings. If you cannot feel the strings, something may be wrong. The IUD may have fallen out of the uterus, or the uterus may have been punctured (perforated) during placement. Also, if the strings are getting longer, it may mean that the IUD is being forced out of the uterus. You no longer have full protection from pregnancy if any of these problems occur.  You may resume sexual intercourse if you are not having problems with the IUD. The copper IUD is considered immediately effective, and the hormone IUD works right away if inserted within 7 days of your period starting. You will need to use a backup method of birth control for 7 days if the IUD in inserted at  any other time in your cycle.  Continue to check that the IUD is still in place by feeling for the strings after every menstrual period.  You may need to take pain medicine such as acetaminophen or ibuprofen. Only take medicines as directed by your health care provider. SEEK MEDICAL CARE IF:   You have bleeding that is heavier or lasts longer than a normal menstrual cycle.  You have a fever.  You have increasing cramps or abdominal pain not relieved with medicine.  You have abdominal pain that does not seem to be related to the same area of earlier cramping  and pain.  You are lightheaded, unusually weak, or faint.  You have abnormal vaginal discharge or smells.  You have pain during sexual intercourse.  You cannot feel the IUD strings, or the IUD string has gotten longer.  You feel the IUD at the opening of the cervix in the vagina.  You think you are pregnant, or you miss your menstrual period.  The IUD string is hurting your sex partner. MAKE SURE YOU:  Understand these instructions.  Will watch your condition.  Will get help right away if you are not doing well or get worse. Document Released: 03/26/2011 Document Revised: 05/18/2013 Document Reviewed: 01/16/2013 [facility] Patient Information 2014 [place], [place].

## 2013-10-18 NOTE — MAU Provider Note (Signed)
History     CSN: [id number]  Arrival date and time: 10/18/13 1707   First Provider Initiated Contact with Patient 10/18/13 1748      Chief Complaint  Patient presents with  . Abdominal Pain  . Vaginal Discharge   HPI  Pt is not pregnant and [facility]  Pt c/o pelvic pain for about 1 month and having pressure with urination. Husband says they are unable to feel the IUD string for about 4 months. Pt does not know if she has had a fever. Pt has hx of MESH  RN note: PT SAYS SHE HAD IUD INSERTED JAN 2014 AT [facility]. LAST TIME SAW STRING WAS MARCH 2014. ABD PAIN STARTED 08-2013 THEN BECAME WORSE THIS MTH- AND VERY BAD X2 WEEKS. SHE CANNOT FEEL STRING. WENT TO [facility] ON 2-24- AND THEY COULD NOT FEEL STRING. Marland Kitchen. LAST SEX- Sunday- USED [employer]  Past Medical History  Diagnosis Date  . No pertinent past medical history   . Umbilical hernia   . Headache(784.0)   . Shortness of breath     Past Surgical History  Procedure Laterality Date  . Cesarean section  05/15/2012  . Anal fissure repair  08/16/2009  . Cesarean section  12/03/2010  . Umbilical hernia repair N/A 12/20/2012    Procedure: LAPAROSCOPIC UMBILICAL HERNIA;  Surgeon: Shelly Rubensteinouglas A Blackman, MD;  Location: [facility];  Service: General;  Laterality: N/A;  . Insertion of mesh N/A 12/20/2012    Procedure: INSERTION OF MESH;  Surgeon: Shelly Rubensteinouglas A Blackman, MD;  Location: [facility];  Service: General;  Laterality: N/A;  . Hernia repair      No family history on file.  History  Substance Use Topics  . Smoking status: Never Smoker   . Smokeless tobacco: Not on file  . Alcohol Use: No    Allergies:  Allergies  Allergen Reactions  . Vancomycin Itching and Rash    Prescriptions prior to admission  Medication Sig Dispense Refill  . acetaminophen (TYLENOL) 500 MG tablet Take 1,000 mg by mouth every 6 (six) hours as needed.      Marland Kitchen. HYDROcodone-acetaminophen (NORCO/VICODIN) 5-325 MG per tablet Take 1 tablet by mouth every 6 (six)  hours as needed for moderate pain.  20 tablet  0    Review of Systems  Gastrointestinal: Positive for abdominal pain. Negative for nausea and vomiting.  Genitourinary: Positive for dysuria.   Physical Exam   Blood pressure 109/57, pulse 82, temperature 98.2 F (36.8 C), temperature source Oral, resp. rate 16, height 5' 2.25" (1.581 m), weight 90.992 kg (200 lb 9.6 oz), SpO2 99.00%.  Physical Exam  Nursing note and vitals reviewed. Constitutional: She is oriented to person, place, and time. She appears well-developed and well-nourished. No distress.  HENT:  Head: Normocephalic.  Eyes: Pupils are equal, round, and reactive to light.  Neck: Normal range of motion. Neck supple.  Cardiovascular: Normal rate.   Respiratory: Effort normal.  GI: Soft.  Genitourinary: Vagina normal and uterus normal. No vaginal discharge found.  IUD string not visible  Musculoskeletal: Normal range of motion.  Neurological: She is alert and oriented to person, place, and time.  Skin: Skin is warm and dry.  Psychiatric: She has a normal mood and affect.  CBC Pelvic US Wet prep GC/Chlamydia Care handed over to Catalina AntiguaPeggy Curtis Cain, MD  MAU Course  Procedures  Pelvic ultrasound FINDINGS  Uterus  Measurements: 9.1 x 3.7 x 4.7 cm. No fibroids or other mass  visualized.  Endometrium  Thickness: 13 mm. IUD poorly visualized but within the uterine  fundus.  Right ovary  Measurements: 3.4 x 2.9 x 2.2 cm. Involuting 1.7 cm corpus luteal  cyst.  Left ovary  Measurements: 2.9 x 1.4 x 1.5 cm. Normal appearance/no adnexal  mass.  Other findings  No free fluid.  IMPRESSION  IUD in satisfactory position.  Involuting right corpus luteal cyst.  SIGNATURE  Electronically Signed  By: Charline Bills M.D.  On: 10/18/2013 18:51    Assessment and Plan   LINEBERRY,SUSAN 10/18/2013, 6:01 PM   A/P 33 yo G2P2 with 1 month h/o pelvic pain and concerns for IUD position - Patient informed of ultrasound  results and adequate position of IUD - Also informed of yeast infection- Rx diflucan provided - Advised to use ibuprofen for pain  - Patient desires IUD to be removed. An appointment at [facility] clinic will be scheduled for IUD removal given that IUD strings were not visible. Patient plans to use OCP post IUD removal

## 2013-10-18 NOTE — MAU Note (Signed)
Patient states she had  Mirena IUD placed about one year ago. States she has had irregular periods. Started having lower abdominal pain for about one month but is getting worse. Denies bleeding but does have a slight vaginal discharge no odor. Vomiting and feeling dizzy off and on for about 2 weeks. Had a negative home pregnancy test last week.

## 2013-10-18 NOTE — MAU Note (Signed)
PT SAYS  SHE HAD IUD INSERTED  JAN 2014 AT [facility].   LAST TIME SAW STRING WAS MARCH 2014.  ABD PAIN  STARTED  08-2013  THEN BECAME  WORSE  THIS MTH- AND VERY BAD  X2 WEEKS.    SHE CANNOT FEEL STRING.   WENT TO [facility] ON 2-24- AND THEY COULD NOT FEEL STRING.  Marland Kitchen.  LAST SEX-  Sunday-  USED [employer]DOMS

## 2013-10-19 LAB — GC/CHLAMYDIA PROBE AMP
CT Probe RNA: NEGATIVE
GC Probe RNA: NEGATIVE

## 2013-10-21 ENCOUNTER — Encounter: Payer: Self-pay | Admitting: Nurse Practitioner

## 2013-10-21 ENCOUNTER — Ambulatory Visit (INDEPENDENT_AMBULATORY_CARE_PROVIDER_SITE_OTHER): Payer: Self-pay | Admitting: Nurse Practitioner

## 2013-10-21 VITALS — BP 110/76 | HR 78 | Temp 97.2°F | Ht 63.0 in | Wt 197.5 lb

## 2013-10-21 DIAGNOSIS — Z309 Encounter for contraceptive management, unspecified: Secondary | ICD-10-CM

## 2013-10-21 DIAGNOSIS — Z30432 Encounter for removal of intrauterine contraceptive device: Secondary | ICD-10-CM

## 2013-10-21 MED ORDER — NORGESTIMATE-ETH ESTRADIOL 0.25-35 MG-MCG PO TABS: 1.0000 | ORAL_TABLET | Freq: Every day | ORAL | Status: DC

## 2013-10-21 NOTE — Progress Notes (Signed)
History:  Heidi [name] a 33 y.o. G2P2002 who [facility]. She had this placed one year ago and has not been able to feel the strings for 9 months. She was seen in MAU and they were unable to find strings, sent her for IUD placement and it was properly placed. It is causing her several problem such as pelvic pains, yeast infections and weight gain. She was reassured concerning these issues but she wants it out.  The following portions of the patient's history were reviewed and updated as appropriate: allergies, current medications, past family history, past medical history, past social history, past surgical history and problem list.  Review of Systems:  Pertinent items are noted in HPI.  Objective:  Physical Exam BP 110/76  Pulse 78  Temp(Src) 97.2 F (36.2 C) (Oral)  Ht 5\' 3"  (1.6 m)  Wt 197 lb 8 oz (89.585 kg)  BMI 34.99 kg/m2 GENERAL: Well-developed, well-nourished female in no acute distress.  HEENT: Normocephalic, atraumatic.  NECK: Supple. Normal thyroid.  ABDOMEN: Soft, nontender, nondistended. No organomegaly. Normal bowel sounds appreciated in all quadrants.  PELVIC: Normal external female genitalia. Vagina is pink and rugated.  Normal discharge. Normal cervix contour. Uterus is normal in size. No adnexal mass or tenderness.  EXTREMITIES: No cyanosis, clubbing, or edema, 2+ distal pulses.     IUD Removal  Patient was in the dorsal lithotomy position, normal external genitalia was noted.  A speculum was placed in the patient's vagina, normal discharge was noted, no lesions. The multiparous cervix was visualized, no lesions, no abnormal discharge.   (The strings of the IUD were not visualized, so Kelly forceps were introduced into the endometrial cavity and the IUD was grasped and removed in its entirety). The cervical speculum was also used.  Patient tolerated the procedure well.        Labs and Imaging [place] Transvaginal  Non-ob  10/18/2013   CLINICAL DATA Pain, IUD  EXAM TRANSABDOMINAL AND TRANSVAGINAL ULTRASOUND OF PELVIS  TECHNIQUE Both transabdominal and transvaginal ultrasound examinations of the pelvis were performed. Transabdominal technique was performed for global imaging of the pelvis including uterus, ovaries, adnexal regions, and pelvic cul-de-sac. It was necessary to proceed with endovaginal exam following the transabdominal exam to visualize the bilateral ovaries.  COMPARISON None  FINDINGS Uterus  Measurements: 9.1 x 3.7 x 4.7 cm. No fibroids or other mass visualized.  Endometrium  Thickness: 13 mm. IUD poorly visualized but within the uterine fundus.  Right ovary  Measurements: 3.4 x 2.9 x 2.2 cm. Involuting 1.7 cm corpus luteal cyst.  Left ovary  Measurements: 2.9 x 1.4 x 1.5 cm. Normal appearance/no adnexal mass.  Other findings  No free fluid.  IMPRESSION IUD in satisfactory position.  Involuting right corpus luteal cyst.  SIGNATURE  Electronically Signed   By: Charline BillsSriyesh  Krishnan M.D.   On: 10/18/2013 18:51      Assessment & Plan:  Assessment:  Contraception management IUD removal  Plans:  Cleta AlbertsMirenia IUD removed without issues OrthoCyclen BCP start today and use back up method for one month Return if needed  Delbert PhenixLinda M Jamilah Jean, NP 10/21/2013 10:57 AM

## 2013-10-21 NOTE — Patient Instructions (Signed)
Contraception Choices °Birth control (contraception) is the use of any methods or devices to stop pregnancy from happening. Below are some methods to help avoid pregnancy. °HORMONAL BIRTH CONTROL °· A small tube put under the skin of the upper arm (implant). The tube can stay in place for 3 years. The implant must be taken out after 3 years. °· Shots given every 3 months. °· Pills taken every day. °· Patches that are changed once a week. °· A ring put into the vagina (vaginal ring). The ring is left in place for 3 weeks and removed for 1 week. Then, a new ring is put in the vagina. °· Emergency birth control pills taken after unprotected sex (intercourse). °BARRIER BIRTH CONTROL  °· A thin covering worn on the penis (female condom) during sex. °· A soft, loose covering put into the vagina (female condom) before sex. °· A rubber bowl that sits over the cervix (diaphragm). The bowl must be made for you. The bowl is put into the vagina before sex. The bowl is left in place for 6 to 8 hours after sex. °· A small, soft cup that fits over the cervix (cervical cap). The cup must be made for you. The cup can be left in place for 48 hours after sex. °· A sponge that is put into the vagina before sex. °· A chemical that kills or stops sperm from getting into the cervix and uterus (spermicide). The chemical may be a cream, jelly, foam, or pill. °INTRAUTERINE (IUD) BIRTH CONTROL  °· IUD birth control is a small, T-shaped piece of plastic. The plastic is put inside the uterus. There are 2 types of IUD: °· Copper IUD. The IUD is covered in copper wire. The copper makes a fluid that kills sperm. It can stay in place for 10 years. °· Hormone IUD. The hormone stops pregnancy from happening. It can stay in place for 5 years. °PERMANENT METHODS °· When the woman has her fallopian tubes sealed, tied, or blocked during surgery. This stops the egg from traveling to the uterus. °· The doctor places a small coil or insert into each fallopian  tube. This causes scar tissue to form and blocks the fallopian tubes. °· When the female has the tubes that carry sperm tied off (vasectomy). °NATURAL FAMILY PLANNING BIRTH CONTROL  °· Natural family planning means not having sex or using barrier birth control on the days the woman could become pregnant. °· Use a calendar to keep track of the length of each period and know the days she can get pregnant. °· Avoid sex during ovulation. °· Use a thermometer to measure body temperature. Also watch for symptoms of ovulation. °· Time sex to be after the woman has ovulated. °Use condoms to help protect yourself against sexually transmitted infections (STIs). Do this no matter what type of birth control you use. Talk to your doctor about which type of birth control is best for you. °Document Released: 05/25/2009 Document Revised: 03/30/2013 Document Reviewed: 02/16/2013 °ExitCare® Patient Information ©2014 [employer]. ° °

## 2013-10-31 ENCOUNTER — Encounter: Payer: Self-pay | Admitting: *Deleted

## 2014-06-12 ENCOUNTER — Encounter: Payer: Self-pay | Admitting: Nurse Practitioner

## 2014-12-12 ENCOUNTER — Ambulatory Visit: Payer: Self-pay

## 2015-04-12 ENCOUNTER — Encounter (HOSPITAL_COMMUNITY): Payer: Self-pay

## 2015-04-12 ENCOUNTER — Emergency Department (HOSPITAL_COMMUNITY): Payer: Self-pay

## 2015-04-12 ENCOUNTER — Emergency Department (HOSPITAL_COMMUNITY)
Admission: EM | Admit: 2015-04-12 | Discharge: 2015-04-13 | Disposition: A | Discharge status: Home / Self Care | Payer: Self-pay | Attending: Emergency Medicine | Admitting: Emergency Medicine

## 2015-04-12 DIAGNOSIS — Z3202 Encounter for pregnancy test, result negative: Secondary | ICD-10-CM | POA: Insufficient documentation

## 2015-04-12 DIAGNOSIS — K429 Umbilical hernia without obstruction or gangrene: Secondary | ICD-10-CM

## 2015-04-12 DIAGNOSIS — N390 Urinary tract infection, site not specified: Secondary | ICD-10-CM

## 2015-04-12 DIAGNOSIS — Z79899 Other long term (current) drug therapy: Secondary | ICD-10-CM | POA: Insufficient documentation

## 2015-04-12 LAB — URINE MICROSCOPIC-ADD ON

## 2015-04-12 LAB — BASIC METABOLIC PANEL
Anion gap: 6 (ref 5–15)
BUN: 12 mg/dL (ref 6–20)
CALCIUM: 8.8 mg/dL — AB (ref 8.9–10.3)
CHLORIDE: 112 mmol/L — AB (ref 101–111)
CO2: 22 mmol/L (ref 22–32)
CREATININE: 0.73 mg/dL (ref 0.44–1.00)
GFR calc non Af Amer: >60 mL/min (ref >60)
Glucose, Bld: 97 mg/dL (ref 65–99)
Potassium: 3.6 mmol/L (ref 3.5–5.1)
SODIUM: 140 mmol/L (ref 135–145)

## 2015-04-12 LAB — CBC WITH DIFFERENTIAL/PLATELET
BASOS PCT: 0 % (ref 0–1)
Basophils Absolute: 0 10*3/uL (ref 0.0–0.1)
EOS ABS: 0.1 10*3/uL (ref 0.0–0.7)
EOS PCT: 1 % (ref 0–5)
HCT: 40.3 % (ref 36.0–46.0)
Hemoglobin: 13 g/dL (ref 12.0–15.0)
LYMPHS ABS: 3.7 10*3/uL (ref 0.7–4.0)
Lymphocytes Relative: 41 % (ref 12–46)
MCH: 28.3 pg (ref 26.0–34.0)
MCHC: 32.3 g/dL (ref 30.0–36.0)
MCV: 87.6 fL (ref 78.0–100.0)
MONO ABS: 0.8 10*3/uL (ref 0.1–1.0)
MONOS PCT: 9 % (ref 3–12)
NEUTROS PCT: 49 % (ref 43–77)
Neutro Abs: 4.6 10*3/uL (ref 1.7–7.7)
PLATELETS: 220 10*3/uL (ref 150–400)
RBC: 4.6 MIL/uL (ref 3.87–5.11)
RDW: 12.9 % (ref 11.5–15.5)
WBC: 9.2 10*3/uL (ref 4.0–10.5)

## 2015-04-12 LAB — URINALYSIS, ROUTINE W REFLEX MICROSCOPIC
BILIRUBIN URINE: NEGATIVE
Glucose, UA: NEGATIVE mg/dL
HGB URINE DIPSTICK: NEGATIVE
KETONES UR: NEGATIVE mg/dL
NITRITE: NEGATIVE
PH: 8 (ref 5.0–8.0)
Protein, ur: NEGATIVE mg/dL
SPECIFIC GRAVITY, URINE: 1.021 (ref 1.005–1.030)
Urobilinogen, UA: 0.2 mg/dL (ref 0.0–1.0)

## 2015-04-12 LAB — PREGNANCY, URINE: PREG TEST UR: NEGATIVE

## 2015-04-12 MED ORDER — MORPHINE SULFATE (PF) 4 MG/ML IV SOLN
4.0000 mg | Freq: Once | INTRAVENOUS | Status: AC
  Administered 2015-04-12: 4 mg via INTRAVENOUS
  Filled 2015-04-12: qty 1

## 2015-04-12 MED ORDER — IOHEXOL 300 MG/ML  SOLN
25.0000 mL | Freq: Once | INTRAMUSCULAR | Status: AC | PRN
  Administered 2015-04-12: 25 mL via ORAL

## 2015-04-12 MED ORDER — SODIUM CHLORIDE 0.9 % IV BOLUS (SEPSIS)
1000.0000 mL | Freq: Once | INTRAVENOUS | Status: AC
  Administered 2015-04-12: 1000 mL via INTRAVENOUS

## 2015-04-12 MED ORDER — IOHEXOL 300 MG/ML  SOLN
100.0000 mL | Freq: Once | INTRAMUSCULAR | Status: AC | PRN
  Administered 2015-04-12: 100 mL via INTRAVENOUS

## 2015-04-12 MED ORDER — DEXTROSE 5 % IV SOLN
1.0000 g | Freq: Once | INTRAVENOUS | Status: AC
  Administered 2015-04-12: 1 g via INTRAVENOUS
  Filled 2015-04-12: qty 10

## 2015-04-12 MED ORDER — ONDANSETRON HCL 4 MG/2ML IJ SOLN
4.0000 mg | Freq: Once | INTRAMUSCULAR | Status: AC
  Administered 2015-04-12: 4 mg via INTRAVENOUS
  Filled 2015-04-12: qty 2

## 2015-04-12 NOTE — Progress Notes (Signed)
Patient listed as not having insurance or a pcp.  EDCM spoke to patient at bedside.  Patient reports she has the orange card.  Patient presented her orange card to [facility].  EDCM informed her that her pcp is located at the bottom left hand corner of her card which is [facility] the [facility] [place].  [facility] provided patient with contact information to her pcp.  System updated.  Patient thankful for services.  No further EDCm needs at this time.

## 2015-04-12 NOTE — ED Notes (Signed)
Pt complains of right sided abdominal pain, hx of umbilical hernia, last surgery here by Dr Rayburn Ma in 2014, she states that pain is the same but a little worse, no vomiting or diarrhea but she feels dizzy

## 2015-04-12 NOTE — ED Notes (Signed)
Nurse drawing labs. 

## 2015-04-12 NOTE — ED Notes (Signed)
Patient transported to CT 

## 2015-04-12 NOTE — ED Provider Notes (Signed)
CSN: [id number]     Arrival date & time 04/12/15  2002 History   First MD Initiated Contact with Patient 04/12/15 2019     Chief Complaint  Patient presents with  . Abdominal Pain     (Consider location/radiation/quality/duration/timing/severity/associated sxs/prior Treatment) HPI   Raymie Hauck 34 y.o.female  PCP: Default, Provider, MD  Blood pressure 129/55, pulse 66, temperature 98 F (36.7 C), resp. rate 18, height 5\' 3"  (1.6 m), weight 180 lb (81.647 kg), last menstrual period 03/20/2015, SpO2 94 %.  SIGNIFICANT PMH: umbilical hernia, headaches, SOB CHIEF COMPLAINT: Abdominal pain  When: 4 days ago How: pt had umbilical hernia repair surgery 7 weeks ago in [place]. She flew back to the [place], 2 weeks ago, and 4 days ago started to have belly pain that is worsening. She admits to doing a lot more physical activity than normal the past few weeks.  Chronicity: acute Location: periumbilical and right upper/mid quadrant Radiation: does not radiate Quality and severity: pressure and sharp, pain is now severe. Alleviating factors: None Worsening factors: touching and moving Treatments tried: pain medications Associated Symptoms: dizziness Negative ROS: Confusion, diaphoresis, fever, headache, weakness (general or focal), change of vision,  neck pain, dysphagia, aphagia, chest pain, shortness of breath,  back pain, nausea, vomiting, diarrhea, lower extremity swelling, rash.   Past Medical History  Diagnosis Date  . No pertinent past medical history   . Umbilical hernia   . Headache(784.0)   . Shortness of breath    Past Surgical History  Procedure Laterality Date  . Cesarean section  05/15/2012  . Anal fissure repair  08/16/2009  . Cesarean section  12/03/2010  . Umbilical hernia repair N/A 12/20/2012    Procedure: LAPAROSCOPIC UMBILICAL HERNIA;  Surgeon: Shelly Rubenstein, MD;  Location: [facility];  Service: General;  Laterality: N/A;  . Insertion of mesh N/A 12/20/2012   Procedure: INSERTION OF MESH;  Surgeon: Shelly Rubenstein, MD;  Location: [facility];  Service: General;  Laterality: N/A;  . Hernia repair     History reviewed. No pertinent family history. Social History  Substance Use Topics  . Smoking status: Never Smoker   . Smokeless tobacco: None  . Alcohol Use: No   OB History    Gravida Para Term Preterm AB TAB SAB Ectopic Multiple Living   2 2 2       2      Review of Systems  10 Systems reviewed and are negative for acute change except as noted in the HPI.    Allergies  Vancomycin  Home Medications   Prior to Admission medications   Medication Sig Start Date End Date Taking? Authorizing Provider  acetaminophen (TYLENOL) 500 MG tablet Take 1,000 mg by mouth every 6 (six) hours as needed for moderate pain.    Yes Historical Provider, MD  cephALEXin (KEFLEX) 500 MG capsule Take 1 capsule (500 mg total) by mouth 4 (four) times daily. 04/13/15   Tenleigh Byer Neva Seat, PA-C  ibuprofen (ADVIL,MOTRIN) 600 MG tablet Take 1 tablet (600 mg total) by mouth every 6 (six) hours as needed. Patient not taking: Reported on 04/12/2015 10/18/13   Catalina Antigua, MD  naproxen (NAPROSYN) 500 MG tablet Take 1 tablet (500 mg total) by mouth 2 (two) times daily. 04/13/15   Marlon Pel, PA-C  norgestimate-ethinyl estradiol (ORTHO-CYCLEN,SPRINTEC,PREVIFEM) 0.25-35 MG-MCG tablet Take 1 tablet by mouth daily. Patient not taking: Reported on 04/12/2015 10/21/13   Delbert Phenix, NP   BP 103/61 mmHg  Pulse 68  Temp(Src)  98 F (36.7 C)  Resp 20  Ht 5\' 3"  (1.6 m)  Wt 180 lb (81.647 kg)  BMI 31.89 kg/m2  SpO2 99%  LMP 03/20/2015 Physical Exam  Constitutional: She appears well-developed and well-nourished. No distress.  HENT:  Head: Normocephalic and atraumatic.  Eyes: Pupils are equal, round, and reactive to light.  Neck: Normal range of motion. Neck supple.  Cardiovascular: Normal rate and regular rhythm.   Pulmonary/Chest: Effort normal.  Abdominal: Soft. There is  no hepatosplenomegaly. There is tenderness in the right upper quadrant and periumbilical area. There is no rebound, no guarding and no CVA tenderness. No hernia.    Neurological: She is alert.  Skin: Skin is warm and dry.  Nursing note and vitals reviewed.   ED Course  Procedures (including critical care time) Labs Review Labs Reviewed  URINALYSIS, ROUTINE W REFLEX MICROSCOPIC (NOT AT [facility]) - Abnormal; Notable for the following:    APPearance CLOUDY (*)    Leukocytes, UA MODERATE (*)    All other components within normal limits  BASIC METABOLIC PANEL - Abnormal; Notable for the following:    Chloride 112 (*)    Calcium 8.8 (*)    All other components within normal limits  URINE MICROSCOPIC-ADD ON - Abnormal; Notable for the following:    Squamous Epithelial / LPF FEW (*)    Bacteria, UA FEW (*)    All other components within normal limits  URINE CULTURE  CBC WITH DIFFERENTIAL/PLATELET  PREGNANCY, URINE    Imaging Review Ct Abdomen Pelvis W Contrast  04/12/2015   CLINICAL DATA:  Right-sided abdominal pain. Previous umbilical hernia repair.  EXAM: CT ABDOMEN AND PELVIS WITH CONTRAST  TECHNIQUE: Multidetector CT imaging of the abdomen and pelvis was performed using the standard protocol following bolus administration of intravenous contrast.  CONTRAST:  25mL OMNIPAQUE IOHEXOL 300 MG/ML SOLN, OMNIPAQUE IOHEXOL 300 MG/ML SOLN  COMPARISON:  06/22/2013  FINDINGS: Lower chest:  Lung bases are clear.  Hepatobiliary: Liver and gallbladder are unremarkable.  Pancreas: Normal  Spleen: Normal  Adrenals/Urinary Tract: Adrenal glands and kidneys appear normal. Bladder is normal.  Stomach/Bowel: Normal appendix, image 47. No bowel wall thickening or focal segmental dilatation is identified.  Vascular/Lymphatic: No aortic aneurysm.  No lymphadenopathy.  Other: Abdominal wall ventral diastasis reidentified. Stranding in the supraumbilical region is identified with small fat containing umbilical  hernia reidentified. No evidence for bowel entrapment.  Uterus and ovaries are normal. No pelvic free fluid and scratch but no pelvic free fluid. No free air.  Musculoskeletal: Left ischial probable bone island reidentified. No new acute osseous abnormality.  IMPRESSION: Fat containing umbilical hernia with interval development of supraumbilical soft tissue stranding/thickening. This could indicate cellulitis or fat necrosis/entrapment. No evidence for bowel entrapment or obstruction.  Ventral abdominal wall diastasis.   Electronically Signed   By: Christiana Pellant M.D.   On: 04/12/2015 23:35   I have personally reviewed and evaluated these images and lab results as part of my medical decision-making.   EKG Interpretation None      MDM   Final diagnoses:  UTI (lower urinary tract infection)  Umbilical hernia without obstruction and without gangrene    I spoke with DR. Criss Alvine who recommends consulting surgery regarding the CT scan results. Dr. Abbey Chatters has reviewed her CT scan, concern for small reoccurring fat containing umbilical hernia that is currently entrapped. He does not recommend attempting to reduce the hernia at this time in the ER. He recommends Ice, rest, NSAIDS and  to f/u with the office for another repair.  Due to area not having fluid drainage or being cellulitis appearing of induration and erythema he doubts infectious etiology and does not recommend abx at this time.  CBC, BMP, urine preg are unremarkable.  Urinalysis shows positive leukocytes, few squamous cells. Urine culture added on.  Medications  sodium chloride 0.9 % bolus 1,000 mL (0 mLs Intravenous Stopped 04/12/15 2310)  morphine 4 MG/ML injection 4 mg (4 mg Intravenous Given 04/12/15 2211)  ondansetron (ZOFRAN) injection 4 mg (4 mg Intravenous Given 04/12/15 2210)  iohexol (OMNIPAQUE) 300 MG/ML solution 25 mL (25 mLs Oral Contrast Given 04/12/15 2141)  cefTRIAXone (ROCEPHIN) 1 g in dextrose 5 % 50 mL IVPB (1 g  Intravenous New Bag/Given 04/12/15 2332)  iohexol (OMNIPAQUE) 300 MG/ML solution 100 mL (100 mLs Intravenous Contrast Given 04/12/15 2306)    33 y.o.Chevelle Coburn's evaluation in the Emergency Department is complete. It has been determined that no acute conditions requiring further emergency intervention are present at this time. The patient/guardian have been advised of the diagnosis and plan. We have discussed signs and symptoms that warrant return to the ED, such as changes or worsening in symptoms.  Vital signs are stable at discharge. Filed Vitals:   04/13/15 0043  BP: 103/61  Pulse: 68  Temp:   Resp: 20    Patient/guardian has voiced understanding and agreed to follow-up with the PCP or specialist.     Marlon Pel, PA-C 04/13/15 1610  Pricilla Loveless, MD 04/14/15 0200

## 2015-04-13 MED ORDER — CEPHALEXIN 500 MG PO CAPS
500.0000 mg | ORAL_CAPSULE | Freq: Four times a day (QID) | ORAL | Status: DC
Start: 2015-04-13 — End: 2015-05-22

## 2015-04-13 MED ORDER — NAPROXEN 500 MG PO TABS: 500.0000 mg | ORAL_TABLET | Freq: Two times a day (BID) | ORAL | Status: DC

## 2015-04-13 NOTE — Discharge Instructions (Signed)
Hernia A hernia occurs when an internal organ pushes out through a weak spot in the abdominal wall. Hernias most commonly occur in the groin and around the navel. Hernias often can be pushed back into place (reduced). Most hernias tend to get worse over time. Some abdominal hernias can get stuck in the opening (irreducible or incarcerated hernia) and cannot be reduced. An irreducible abdominal hernia which is tightly squeezed into the opening is at risk for impaired blood supply (strangulated hernia). A strangulated hernia is a medical emergency. Because of the risk for an irreducible or strangulated hernia, surgery may be recommended to repair a hernia. CAUSES   Heavy lifting.  Prolonged coughing.  Straining to have a bowel movement.  A cut (incision) made during an abdominal surgery. HOME CARE INSTRUCTIONS   Bed rest is not required. You may continue your normal activities.  Avoid lifting more than 10 pounds (4.5 kg) or straining.  Cough gently. If you are a smoker it is best to stop. Even the best hernia repair can break down with the continual strain of coughing. Even if you do not have your hernia repaired, a cough will continue to aggravate the problem.  Do not wear anything tight over your hernia. Do not try to keep it in with an outside bandage or truss. These can damage abdominal contents if they are trapped within the hernia sac.  Eat a normal diet.  Avoid constipation. Straining over long periods of time will increase hernia size and encourage breakdown of repairs. If you cannot do this with diet alone, stool softeners may be used. SEEK IMMEDIATE MEDICAL CARE IF:   You have a fever.  You develop increasing abdominal pain.  You feel nauseous or vomit.  Your hernia is stuck outside the abdomen, looks discolored, feels hard, or is tender.  You have any changes in your bowel habits or in the hernia that are unusual for you.  You have increased pain or swelling around the  hernia.  You cannot push the hernia back in place by applying gentle pressure while lying down. MAKE SURE YOU:   Understand these instructions.  Will watch your condition.  Will get help right away if you are not doing well or get worse. Document Released: 07/28/2005 Document Revised: 10/20/2011 Document Reviewed: 03/16/2008 [facility] Patient Information 2015 [place], [place]. This information is not intended to replace advice given to you by your health care provider. Make sure you discuss any questions you have with your health care provider. Urinary Tract Infection Urinary tract infections (UTIs) can develop anywhere along your urinary tract. Your urinary tract is your body's drainage system for removing wastes and extra water. Your urinary tract includes two kidneys, two ureters, a bladder, and a urethra. Your kidneys are a pair of bean-shaped organs. Each kidney is about the size of your fist. They are located below your ribs, one on each side of your spine. CAUSES Infections are caused by microbes, which are microscopic organisms, including fungi, viruses, and bacteria. These organisms are so small that they can only be seen through a microscope. Bacteria are the microbes that most commonly cause UTIs. SYMPTOMS  Symptoms of UTIs may vary by age and gender of the patient and by the location of the infection. Symptoms in young women typically include a frequent and intense urge to urinate and a painful, burning feeling in the bladder or urethra during urination. Older women and men are more likely to be tired, shaky, and weak and have muscle  aches and abdominal pain. A fever may mean the infection is in your kidneys. Other symptoms of a kidney infection include pain in your back or sides below the ribs, nausea, and vomiting. DIAGNOSIS To diagnose a UTI, your caregiver will ask you about your symptoms. Your caregiver also will ask to provide a urine sample. The urine sample will be tested for  bacteria and white blood cells. White blood cells are made by your body to help fight infection. TREATMENT  Typically, UTIs can be treated with medication. Because most UTIs are caused by a bacterial infection, they usually can be treated with the use of antibiotics. The choice of antibiotic and length of treatment depend on your symptoms and the type of bacteria causing your infection. HOME CARE INSTRUCTIONS  If you were prescribed antibiotics, take them exactly as your caregiver instructs you. Finish the medication even if you feel better after you have only taken some of the medication.  Drink enough water and fluids to keep your urine clear or pale yellow.  Avoid caffeine, tea, and carbonated beverages. They tend to irritate your bladder.  Empty your bladder often. Avoid holding urine for long periods of time.  Empty your bladder before and after sexual intercourse.  After a bowel movement, women should cleanse from front to back. Use each tissue only once. SEEK MEDICAL CARE IF:   You have back pain.  You develop a fever.  Your symptoms do not begin to resolve within 3 days. SEEK IMMEDIATE MEDICAL CARE IF:   You have severe back pain or lower abdominal pain.  You develop chills.  You have nausea or vomiting.  You have continued burning or discomfort with urination. MAKE SURE YOU:   Understand these instructions.  Will watch your condition.  Will get help right away if you are not doing well or get worse. Document Released: 05/07/2005 Document Revised: 01/27/2012 Document Reviewed: 09/05/2011 [facility] Patient Information 2015 [place], [place]. This information is not intended to replace advice given to you by your health care provider. Make sure you discuss any questions you have with your health care provider.

## 2015-04-14 LAB — URINE CULTURE: Special Requests: NORMAL

## 2015-04-23 ENCOUNTER — Emergency Department (HOSPITAL_COMMUNITY): Payer: Self-pay

## 2015-04-23 ENCOUNTER — Emergency Department (HOSPITAL_COMMUNITY)
Admission: EM | Admit: 2015-04-23 | Discharge: 2015-04-24 | Disposition: A | Discharge status: Home / Self Care | Payer: Self-pay | Attending: Emergency Medicine | Admitting: Emergency Medicine

## 2015-04-23 ENCOUNTER — Encounter (HOSPITAL_COMMUNITY): Payer: Self-pay | Admitting: Family Medicine

## 2015-04-23 DIAGNOSIS — R1084 Generalized abdominal pain: Secondary | ICD-10-CM

## 2015-04-23 DIAGNOSIS — K429 Umbilical hernia without obstruction or gangrene: Secondary | ICD-10-CM

## 2015-04-23 DIAGNOSIS — Z792 Long term (current) use of antibiotics: Secondary | ICD-10-CM | POA: Insufficient documentation

## 2015-04-23 DIAGNOSIS — Z791 Long term (current) use of non-steroidal anti-inflammatories (NSAID): Secondary | ICD-10-CM | POA: Insufficient documentation

## 2015-04-23 LAB — CBC WITH DIFFERENTIAL/PLATELET
BASOS PCT: 0 % (ref 0–1)
Basophils Absolute: 0 10*3/uL (ref 0.0–0.1)
EOS ABS: 0.1 10*3/uL (ref 0.0–0.7)
EOS PCT: 1 % (ref 0–5)
HCT: 40.1 % (ref 36.0–46.0)
HEMOGLOBIN: 13.4 g/dL (ref 12.0–15.0)
LYMPHS ABS: 2.7 10*3/uL (ref 0.7–4.0)
Lymphocytes Relative: 28 % (ref 12–46)
MCH: 29.4 pg (ref 26.0–34.0)
MCHC: 33.4 g/dL (ref 30.0–36.0)
MCV: 87.9 fL (ref 78.0–100.0)
MONO ABS: 0.8 10*3/uL (ref 0.1–1.0)
MONOS PCT: 9 % (ref 3–12)
Neutro Abs: 6 10*3/uL (ref 1.7–7.7)
Neutrophils Relative %: 62 % (ref 43–77)
PLATELETS: 227 10*3/uL (ref 150–400)
RBC: 4.56 MIL/uL (ref 3.87–5.11)
RDW: 12.9 % (ref 11.5–15.5)
WBC: 9.6 10*3/uL (ref 4.0–10.5)

## 2015-04-23 LAB — COMPREHENSIVE METABOLIC PANEL
ALBUMIN: 4.1 g/dL (ref 3.5–5.0)
ALK PHOS: 66 U/L (ref 38–126)
ALT: 16 U/L (ref 14–54)
ANION GAP: 10 (ref 5–15)
AST: 19 U/L (ref 15–41)
BUN: 16 mg/dL (ref 6–20)
CALCIUM: 8.9 mg/dL (ref 8.9–10.3)
CO2: 21 mmol/L — AB (ref 22–32)
Chloride: 110 mmol/L (ref 101–111)
Creatinine, Ser: 0.5 mg/dL (ref 0.44–1.00)
GFR calc non Af Amer: >60 mL/min (ref >60)
Glucose, Bld: 110 mg/dL — ABNORMAL HIGH (ref 65–99)
POTASSIUM: 3 mmol/L — AB (ref 3.5–5.1)
SODIUM: 141 mmol/L (ref 135–145)
Total Bilirubin: 0.5 mg/dL (ref 0.3–1.2)
Total Protein: 7.1 g/dL (ref 6.5–8.1)

## 2015-04-23 LAB — LIPASE, BLOOD: Lipase: 23 U/L (ref 22–51)

## 2015-04-23 MED ORDER — SODIUM CHLORIDE 0.9 % IV SOLN
INTRAVENOUS | Status: DC
  Administered 2015-04-23: 22:00:00 via INTRAVENOUS

## 2015-04-23 MED ORDER — POTASSIUM CHLORIDE CRYS ER 20 MEQ PO TBCR
40.0000 meq | EXTENDED_RELEASE_TABLET | Freq: Once | ORAL | Status: AC
  Administered 2015-04-24: 40 meq via ORAL
  Filled 2015-04-23: qty 2

## 2015-04-23 MED ORDER — ONDANSETRON HCL 4 MG/2ML IJ SOLN
4.0000 mg | Freq: Once | INTRAMUSCULAR | Status: AC
  Administered 2015-04-23: 4 mg via INTRAVENOUS
  Filled 2015-04-23: qty 2

## 2015-04-23 MED ORDER — HYDROMORPHONE HCL 1 MG/ML IJ SOLN
1.0000 mg | Freq: Once | INTRAMUSCULAR | Status: AC
  Administered 2015-04-23: 1 mg via INTRAVENOUS
  Filled 2015-04-23: qty 1

## 2015-04-23 NOTE — ED Notes (Signed)
Patient is from home. SHe came into the ED today d/t increased abdominal pain x10 days 5/10. Pt recently had hernia repair d/p 10 days ago. Pt was seen in ED prior to today

## 2015-04-23 NOTE — ED Provider Notes (Signed)
CSN: [id number]     Arrival date & time 04/23/15  2048 History   First MD Initiated Contact with Patient 04/23/15 2216     Chief Complaint  Patient presents with  . Abdominal Pain    hernia     (Consider location/radiation/quality/duration/timing/severity/associated sxs/prior Treatment) HPI Comments: Patient here complaining of right upper quadrant abdominal pain rating to her flank 10 days. Seen in the ED 10 days ago and had a CT scan which show possible incarcerated fat. Recently had umbilical hernia surgery done in [place]. She has had nausea but no vomiting. No fever or chills. Was treated for UTI and does endorse some dysuria. Denies any vaginal bleeding or discharge. Did complete the course of antibiotics. Symptoms persistent and nothing makes them better.  Patient is a 34 y.o. female presenting with abdominal pain. The history is provided by the spouse and the patient.  Abdominal Pain   Past Medical History  Diagnosis Date  . No pertinent past medical history   . Umbilical hernia   . Headache(784.0)   . Shortness of breath    Past Surgical History  Procedure Laterality Date  . Cesarean section  05/15/2012  . Anal fissure repair  08/16/2009  . Cesarean section  12/03/2010  . Umbilical hernia repair N/A 12/20/2012    Procedure: LAPAROSCOPIC UMBILICAL HERNIA;  Surgeon: Shelly Rubenstein, MD;  Location: [facility];  Service: General;  Laterality: N/A;  . Insertion of mesh N/A 12/20/2012    Procedure: INSERTION OF MESH;  Surgeon: Shelly Rubenstein, MD;  Location: [facility];  Service: General;  Laterality: N/A;  . Hernia repair     History reviewed. No pertinent family history. Social History  Substance Use Topics  . Smoking status: Never Smoker   . Smokeless tobacco: None  . Alcohol Use: No   OB History    Gravida Para Term Preterm AB TAB SAB Ectopic Multiple Living   Review of Systems  Gastrointestinal: Positive for abdominal pain.  All other systems  reviewed and are negative.     Allergies  Vancomycin  Home Medications   Prior to Admission medications   Medication Sig Start Date End Date Taking? Authorizing Provider  acetaminophen (TYLENOL) 500 MG tablet Take 1,000 mg by mouth every 6 (six) hours as needed for moderate pain.     Historical Provider, MD  cephALEXin (KEFLEX) 500 MG capsule Take 1 capsule (500 mg total) by mouth 4 (four) times daily. 04/13/15   Tiffany Neva Seat, PA-C  ibuprofen (ADVIL,MOTRIN) 600 MG tablet Take 1 tablet (600 mg total) by mouth every 6 (six) hours as needed. Patient not taking: Reported on 04/12/2015 10/18/13   Catalina Antigua, MD  naproxen (NAPROSYN) 500 MG tablet Take 1 tablet (500 mg total) by mouth 2 (two) times daily. 04/13/15   Marlon Pel, PA-C  norgestimate-ethinyl estradiol (ORTHO-CYCLEN,SPRINTEC,PREVIFEM) 0.25-35 MG-MCG tablet Take 1 tablet by mouth daily. Patient not taking: Reported on 04/12/2015 10/21/13   Delbert Phenix, NP   BP 114/67 mmHg  Pulse 92  Temp(Src) 97.9 F (36.6 C) (Oral)  Resp 18  Ht  (1.6 m)  Wt 193 lb (87.544 kg)  BMI 34.20 kg/m2  SpO2 96%  LMP 04/15/2015 Physical Exam  Constitutional: She is oriented to person, place, and time. She appears well-developed and well-nourished.  Non-toxic appearance. No distress.  HENT:  Head: Normocephalic and atraumatic.  Eyes: Conjunctivae, EOM and lids are normal. Pupils  are equal, round, and reactive to light.  Neck: Normal range of motion. Neck supple. No tracheal deviation present. No thyroid mass present.  Cardiovascular: Normal rate, regular rhythm and normal heart sounds.  Exam reveals no gallop.   No murmur heard. Pulmonary/Chest: Effort normal and breath sounds normal. No stridor. No respiratory distress. She has no decreased breath sounds. She has no wheezes. She has no rhonchi. She has no rales.  Abdominal: Soft. Normal appearance and bowel sounds are normal. She exhibits no distension. There is tenderness in the right  upper quadrant and epigastric area. There is no rebound and no CVA tenderness.    Musculoskeletal: Normal range of motion. She exhibits no edema or tenderness.  Neurological: She is alert and oriented to person, place, and time. She has normal strength. No cranial nerve deficit or sensory deficit. GCS eye subscore is 4. GCS verbal subscore is 5. GCS motor subscore is 6.  Skin: Skin is warm and dry. No abrasion and no rash noted.  Psychiatric: She has a normal mood and affect. Her speech is normal and behavior is normal.  Nursing note and vitals reviewed.   ED Course  Procedures (including critical care time) Labs Review Labs Reviewed  CBC WITH DIFFERENTIAL/PLATELET  COMPREHENSIVE METABOLIC PANEL  LIPASE, BLOOD  URINALYSIS, ROUTINE W REFLEX MICROSCOPIC (NOT AT [facility])    Imaging Review No results found. I have personally reviewed and evaluated these images and lab results as part of my medical decision-making.   EKG Interpretation None      MDM   Final diagnoses:  None    Pt to have abd [place] to r/o gallbladder dz and if neg will f/u general surgery, signed out to dr. Johann Capers, MD 05/01/15 1236

## 2015-04-23 NOTE — ED Notes (Signed)
Bed: [facility] Expected date:  Expected time:  Means of arrival:  Comments: EMS 

## 2015-04-23 NOTE — ED Provider Notes (Signed)
34 y/o with abd pain.  9/1 CT IMPRESSION: Fat containing umbilical hernia with interval development of supraumbilical soft tissue stranding/thickening. This could indicate cellulitis or fat necrosis/entrapment. No evidence for bowel entrapment or obstruction.  Ventral abdominal wall diastasis.   She on exam today has increased abd pain, RUQ was the most tender and so [place] ordered. If [place] is neg, Dr. Freida Busman would want pt to get a CT abdomen given the recent surgery and worsening pain. Labs are reassuring.  LATE ENTRY: CT results discussed. She has a surgery f/u this week.  Derwood Kaplan, MD 04/24/15 [phone]

## 2015-04-24 ENCOUNTER — Emergency Department (HOSPITAL_COMMUNITY): Payer: Self-pay

## 2015-04-24 ENCOUNTER — Encounter (HOSPITAL_COMMUNITY): Payer: Self-pay | Admitting: Radiology

## 2015-04-24 LAB — URINALYSIS, ROUTINE W REFLEX MICROSCOPIC
Bilirubin Urine: NEGATIVE
GLUCOSE, UA: NEGATIVE mg/dL
Hgb urine dipstick: NEGATIVE
KETONES UR: 40 mg/dL — AB
LEUKOCYTES UA: NEGATIVE
NITRITE: NEGATIVE
PROTEIN: NEGATIVE mg/dL
Specific Gravity, Urine: 1.011 (ref 1.005–1.030)
UROBILINOGEN UA: 0.2 mg/dL (ref 0.0–1.0)
pH: 7 (ref 5.0–8.0)

## 2015-04-24 MED ORDER — DOCUSATE SODIUM 100 MG PO CAPS: 100.0000 mg | ORAL_CAPSULE | Freq: Two times a day (BID) | ORAL | Status: DC

## 2015-04-24 MED ORDER — IOHEXOL 300 MG/ML  SOLN
100.0000 mL | Freq: Once | INTRAMUSCULAR | Status: AC | PRN
  Administered 2015-04-24: 100 mL via INTRAVENOUS

## 2015-04-24 MED ORDER — HYDROCODONE-ACETAMINOPHEN 5-325 MG PO TABS: 1.0000 | ORAL_TABLET | Freq: Four times a day (QID) | ORAL | Status: DC | PRN

## 2015-04-24 MED ORDER — SODIUM CHLORIDE 0.9 % IV BOLUS (SEPSIS): 1000.0000 mL | Freq: Once | INTRAVENOUS | Status: DC

## 2015-04-24 MED ORDER — DOXYCYCLINE HYCLATE 100 MG PO CAPS: 100.0000 mg | ORAL_CAPSULE | Freq: Two times a day (BID) | ORAL | Status: DC

## 2015-04-24 MED ORDER — IOHEXOL 300 MG/ML  SOLN: 100.0000 mL | Freq: Once | INTRAMUSCULAR | Status: DC | PRN

## 2015-04-24 MED ORDER — IOHEXOL 300 MG/ML  SOLN
50.0000 mL | Freq: Once | INTRAMUSCULAR | Status: AC | PRN
  Administered 2015-04-24: 50 mL via ORAL

## 2015-04-24 NOTE — Discharge Instructions (Signed)
Abdominal Pain, Women °Abdominal (stomach, pelvic, or belly) pain can be caused by many things. It is important to tell your doctor: °· The location of the pain. °· Does it come and go or is it present all the time? °· Are there things that start the pain (eating certain foods, exercise)? °· Are there other symptoms associated with the pain (fever, nausea, vomiting, diarrhea)? °All of this is helpful to know when trying to find the cause of the pain. °CAUSES  °· Stomach: virus or bacteria infection, or ulcer. °· Intestine: appendicitis (inflamed appendix), regional ileitis (Crohn's disease), ulcerative colitis (inflamed colon), irritable bowel syndrome, diverticulitis (inflamed diverticulum of the colon), or cancer of the stomach or intestine. °· Gallbladder disease or stones in the gallbladder. °· Kidney disease, kidney stones, or infection. °· Pancreas infection or cancer. °· Fibromyalgia (pain disorder). °· Diseases of the female organs: °¨ Uterus: fibroid (non-cancerous) tumors or infection. °¨ Fallopian tubes: infection or tubal pregnancy. °¨ Ovary: cysts or tumors. °¨ Pelvic adhesions (scar tissue). °¨ Endometriosis (uterus lining tissue growing in the pelvis and on the pelvic organs). °¨ Pelvic congestion syndrome (female organs filling up with blood just before the menstrual period). °¨ Pain with the menstrual period. °¨ Pain with ovulation (producing an egg). °¨ Pain with an IUD (intrauterine device, birth control) in the uterus. °¨ Cancer of the female organs. °· Functional pain (pain not caused by a disease, may improve without treatment). °· Psychological pain. °· Depression. °DIAGNOSIS  °Your doctor will decide the seriousness of your pain by doing an examination. °· Blood tests. °· X-rays. °· Ultrasound. °· CT scan (computed tomography, special type of X-ray). °· MRI (magnetic resonance imaging). °· Cultures, for infection. °· Barium enema (dye inserted in the large intestine, to better view it with  X-rays). °· Colonoscopy (looking in intestine with a lighted tube). °· Laparoscopy (minor surgery, looking in abdomen with a lighted tube). °· Major abdominal exploratory surgery (looking in abdomen with a large incision). °TREATMENT  °The treatment will depend on the cause of the pain.  °· Many cases can be observed and treated at home. °· Over-the-counter medicines recommended by your caregiver. °· Prescription medicine. °· Antibiotics, for infection. °· Birth control pills, for painful periods or for ovulation pain. °· Hormone treatment, for endometriosis. °· Nerve blocking injections. °· Physical therapy. °· Antidepressants. °· Counseling with a psychologist or psychiatrist. °· Minor or major surgery. °HOME CARE INSTRUCTIONS  °· Do not take laxatives, unless directed by your caregiver. °· Take over-the-counter pain medicine only if ordered by your caregiver. Do not take aspirin because it can cause an upset stomach or bleeding. °· Try a clear liquid diet (broth or water) as ordered by your caregiver. Slowly move to a bland diet, as tolerated, if the pain is related to the stomach or intestine. °· Have a thermometer and take your temperature several times a day, and record it. °· Bed rest and sleep, if it helps the pain. °· Avoid sexual intercourse, if it causes pain. °· Avoid stressful situations. °· Keep your follow-up appointments and tests, as your caregiver orders. °· If the pain does not go away with medicine or surgery, you may try: °¨ Acupuncture. °¨ Relaxation exercises (yoga, meditation). °¨ Group therapy. °¨ Counseling. °SEEK MEDICAL CARE IF:  °· You notice certain foods cause stomach pain. °· Your home care treatment is not helping your pain. °· You need stronger pain medicine. °· You want your IUD removed. °· You feel faint or   lightheaded. °· You develop nausea and vomiting. °· You develop a rash. °· You are having side effects or an allergy to your medicine. °SEEK IMMEDIATE MEDICAL CARE IF:  °· Your  pain does not go away or gets worse. °· You have a fever. °· Your pain is felt only in portions of the abdomen. The right side could possibly be appendicitis. The left lower portion of the abdomen could be colitis or diverticulitis. °· You are passing blood in your stools (bright red or black tarry stools, with or without vomiting). °· You have blood in your urine. °· You develop chills, with or without a fever. °· You pass out. °MAKE SURE YOU:  °· Understand these instructions. °· Will watch your condition. °· Will get help right away if you are not doing well or get worse. °Document Released: 05/25/2007 Document Revised: 12/12/2013 Document Reviewed: 06/14/2009 °[employer]® Patient Information ©2015 [employer]. This information is not intended to replace advice given to you by your health care provider. Make sure you discuss any questions you have with your health care provider. ° °

## 2015-05-15 ENCOUNTER — Other Ambulatory Visit: Payer: Self-pay | Admitting: Surgery

## 2015-05-22 ENCOUNTER — Emergency Department (HOSPITAL_COMMUNITY)
Admission: EM | Admit: 2015-05-22 | Discharge: 2015-05-22 | Disposition: A | Discharge status: Home / Self Care | Payer: No Typology Code available for payment source | Source: Home / Self Care | Attending: Emergency Medicine | Admitting: Emergency Medicine

## 2015-05-22 ENCOUNTER — Encounter (HOSPITAL_COMMUNITY): Payer: Self-pay | Admitting: Emergency Medicine

## 2015-05-22 DIAGNOSIS — K5900 Constipation, unspecified: Secondary | ICD-10-CM

## 2015-05-22 DIAGNOSIS — M6208 Separation of muscle (nontraumatic), other site: Secondary | ICD-10-CM

## 2015-05-22 DIAGNOSIS — K429 Umbilical hernia without obstruction or gangrene: Secondary | ICD-10-CM

## 2015-05-22 MED ORDER — DOCUSATE SODIUM 100 MG PO CAPS
100.0000 mg | ORAL_CAPSULE | Freq: Two times a day (BID) | ORAL | Status: DC
Start: 2015-05-22 — End: 2015-08-28

## 2015-05-22 MED ORDER — LACTULOSE 10 GM/15ML PO SOLN: 10.0000 g | Freq: Three times a day (TID) | ORAL | Status: DC | PRN

## 2015-05-22 NOTE — ED Notes (Signed)
Pt has been suffering from mid abdominal pain that radiates to the right for two months.  She was diagnosed with a hernia in the ED, but have since been told that she does not have one.  It is affecting her daily living skills and tylenol and ibuprofen do not help.

## 2015-05-22 NOTE — Discharge Instructions (Signed)
You have a recurrent hernia by your bellybutton. This may be called an umbilical hernia or an incisional hernia. You also have separation of your abdominal wall muscles.  This does not help the pain from your hernia. Constipation also puts extra strain on the hernia.  Take lactulose 3 times a day as needed for constipation. The goal is to have a soft bowel movement every day. Take Colace 1 pill twice a day. This is a stool softener. Please do the exercises I showed you to strengthen your abdominal wall.  I think if we can get the constipation better and strengthen your abdominal wall muscles, your pain will improve. Follow-up as needed.

## 2015-05-22 NOTE — ED Provider Notes (Signed)
CSN: [id number]     Arrival date & time 05/22/15  1750 History   First MD Initiated Contact with Patient 05/22/15 1823     Chief Complaint  Patient presents with  . Abdominal Pain   (Consider location/radiation/quality/duration/timing/severity/associated sxs/prior Treatment) HPI She is a 34 year old woman here for evaluation of abdominal pain. She has had abdominal pain for several years. In 2014 she had an umbilical hernia repair. At that time she was also told she had rectus diastasis. She continued to have pain after the surgery. In July of this year, when she was in [place], she had to have emergency surgery for incarcerated umbilical hernia.  She states her pain improved after the emergency surgery for about a month, but it has gradually been coming back. The pain is located just above the umbilicus and goes into the right side. No associated nausea or vomiting. She does have constipation. She has tried over-the-counter medications including MiraLAX without much improvement. She does report a lot of straining. She was seen in the ER twice last month for this pain and had CT scans that showed a ventral fat-containing hernia. She states the pain is worse with straining. It is also worse when she tries to bend forward and do anything.  She was seen by her surgeon at [facility] surgery last week and told to follow-up in 3 months. She was given a prescription for tramadol at that time, but she has not yet filled it.  Past Medical History  Diagnosis Date  . No pertinent past medical history   . Umbilical hernia   . Headache(784.0)   . Shortness of breath    Past Surgical History  Procedure Laterality Date  . Cesarean section  05/15/2012  . Anal fissure repair  08/16/2009  . Cesarean section  12/03/2010  . Umbilical hernia repair N/A 12/20/2012    Procedure: LAPAROSCOPIC UMBILICAL HERNIA;  Surgeon: Shelly Rubenstein, MD;  Location: [facility] ORS;  Service: General;  Laterality: N/A;  . Insertion  of mesh N/A 12/20/2012    Procedure: INSERTION OF MESH;  Surgeon: Shelly Rubenstein, MD;  Location: [facility];  Service: General;  Laterality: N/A;  . Hernia repair     History reviewed. No pertinent family history. Social History  Substance Use Topics  . Smoking status: Never Smoker   . Smokeless tobacco: None  . Alcohol Use: No   OB History    Gravida Para Term Preterm AB TAB SAB Ectopic Multiple Living   Review of Systems As in history of present illness Allergies  Vancomycin  Home Medications   Prior to Admission medications   Medication Sig Start Date End Date Taking? Authorizing Provider  acetaminophen (TYLENOL) 500 MG tablet Take 1,000 mg by mouth every 6 (six) hours as needed for moderate pain.    Yes Historical Provider, MD  ibuprofen (ADVIL,MOTRIN) 600 MG tablet Take 1 tablet (600 mg total) by mouth every 6 (six) hours as needed. 10/18/13  Yes Peggy Constant, MD  docusate sodium (COLACE) 100 MG capsule Take 1 capsule (100 mg total) by mouth every 12 (twelve) hours. 05/22/15   Charm Rings, MD  HYDROcodone-acetaminophen (NORCO/VICODIN) 5-325 MG per tablet Take 1 tablet by mouth every 6 (six) hours as needed. 04/24/15   Derwood Kaplan, MD  lactulose (CHRONULAC) 10 GM/15ML solution Take 15 mLs (10 g total) by mouth 3 (three) times daily as needed for mild constipation or  moderate constipation. 05/22/15   Charm Rings, MD   Meds Ordered and Administered this Visit  Medications - No data to display  BP 134/78 mmHg  Pulse 91  Temp(Src) 97.8 F (36.6 C) (Oral)  Resp 16  SpO2 100%  LMP 05/10/2015 (Exact Date) No data found.   Physical Exam  Constitutional: She is oriented to person, place, and time.  Cardiovascular: Normal rate.   Pulmonary/Chest: Effort normal.  Abdominal: Soft. Bowel sounds are normal. She exhibits no distension. There is tenderness. There is no rebound and no guarding.  Significant rectus diastasis deformity  Neurological: She is  alert and oriented to person, place, and time.    ED Course  Procedures (including critical care time)  Labs Review Labs Reviewed - No data to display  Imaging Review No results found.   MDM   1. Umbilical hernia without obstruction and without gangrene   2. Rectus diastasis   3. Constipation, unspecified constipation type    I suspect her pain is coming from this recurrent umbilical or incisional hernia. Recommended that she requests to see a different surgeon at [facility] surgery for a second opinion. We will treat her constipation with Colace and lactulose. Also recommended that she start doing plank exercises to strengthen her abdominal wall. My hope is that if we can improve her constipation and her abdominal wall strength, this will help balance out the hernia.    Charm Rings, MD 05/22/15 [phone]

## 2015-08-28 ENCOUNTER — Encounter: Payer: Self-pay | Admitting: Family Medicine

## 2015-08-28 ENCOUNTER — Ambulatory Visit (INDEPENDENT_AMBULATORY_CARE_PROVIDER_SITE_OTHER): Payer: No Typology Code available for payment source | Admitting: Family Medicine

## 2015-08-28 VITALS — BP 119/72 | HR 85 | Temp 98.6°F | Ht 63.0 in | Wt 221.4 lb

## 2015-08-28 DIAGNOSIS — K5909 Other constipation: Secondary | ICD-10-CM

## 2015-08-28 DIAGNOSIS — R109 Unspecified abdominal pain: Secondary | ICD-10-CM

## 2015-08-28 DIAGNOSIS — K5900 Constipation, unspecified: Secondary | ICD-10-CM

## 2015-08-28 DIAGNOSIS — K429 Umbilical hernia without obstruction or gangrene: Secondary | ICD-10-CM

## 2015-08-28 LAB — POCT URINE PREGNANCY: Preg Test, Ur: NEGATIVE

## 2015-08-28 MED ORDER — DOCUSATE SODIUM 100 MG PO CAPS: 100.0000 mg | ORAL_CAPSULE | Freq: Two times a day (BID) | ORAL | Status: DC

## 2015-08-28 MED ORDER — POLYETHYLENE GLYCOL 3350 17 GM/SCOOP PO POWD: 17.0000 g | Freq: Three times a day (TID) | ORAL | Status: DC | PRN

## 2015-08-28 NOTE — Patient Instructions (Addendum)
I am referring you to a surgeon to talk about the issues you've been having If you have sudden worsening of your pain and the hernia seems stuck, please go to the ED.  Start miralax 17g three times a day Also add colace  twice a day  Follow up with me in 1 month. Complete the new patient packet & mail it back to [place].  Be well, Dr. Pollie Meyer

## 2015-08-31 DIAGNOSIS — K5909 Other constipation: Secondary | ICD-10-CM | POA: Insufficient documentation

## 2015-08-31 NOTE — Progress Notes (Signed)
  HPI:  Patient presents today for a new patient appointment to establish general primary care, also to discuss referral to surgery. She was referred to [place] by a representative from the [facility] [employer]. Patient speaks English and declined to have an interpreter.  Patient has history of laparoscopic umbilical hernia repair with mesh in 2014. After surgery continued to have pain in abdomen. Was visiting [place] this summer in July and had emergency surgery for an incarcerated umbilical hernia. Since then has had continued pain. Seen in ED twice in September and then at Urgent Care in October for similar complaints. Had CT abdomen which demonstrated fat containing ventral hernia with some soft tissue stranding concerning for possible fat necrosis. Reports she was seen again at general surgery but advised that she did not have a hernia. She wants to be seen again by a different provider.  Has chronic constipation. Has bowel movement about once every 6-7 days. Strains a lot to stool. Has tried numerous medications without relief.   ROS: See HPI  Past Medical Hx:  -chronic constipation -knee arthritis  Past Surgical Hx:  -two prior cesarean sections -anal fissure surgery -2014 mesh hernia repair -July 2016 emergency hernia surgery in [place]  Family Hx: updated in Epic  Social Hx: lives with husband and two children  PHYSICAL EXAM: BP 119/72 mmHg  Pulse 85  Temp(Src) 98.6 F (37 C) (Oral)  Ht  (1.6 m)  Wt 221 lb 6.4 oz (100.426 kg)  BMI 39.23 kg/m2  LMP 08/19/2015 Gen: NAD, pleasant, cooperative HEENT: normocephalic, atraumatic, MMM Heart: regular rate and rhythm no murmur Lungs: clear to auscultation bilaterally, NWOB Abdomen: soft. Mildly tender to palpation throughout but no guarding or rebound. No peritoneal signs. Abdominal wall laxity present with outward bulging of abdominal midline during valsalva. No entrapped masses. Neuro: alert, grossly nonfocal,  speech normal  ASSESSMENT/PLAN:  Umbilical hernia No acute incarceration. Urine pregnancy test negative. Will refer to general surgery, and ask that she see a different provider per her request. Discussed ED precautions.  Chronic constipation Will start on good bowel regimen, with miralax three times daily and colace twice daily. Follow up with me in 1 month.    Establishing care - given new patient packet to fill out, since she has not done this yet. Asked that she mail it back to [place].  FOLLOW UP: Follow up in 1 mo for constipation Referring to general surgery  [place] J. Pollie Meyer, MD [facility]

## 2015-08-31 NOTE — Assessment & Plan Note (Signed)
Will start on good bowel regimen, with miralax three times daily and colace twice daily. Follow up with me in 1 month.

## 2015-08-31 NOTE — Assessment & Plan Note (Addendum)
No acute incarceration. Urine pregnancy test negative. Will refer to general surgery, and ask that she see a different provider per her request. Discussed ED precautions.

## 2015-09-28 ENCOUNTER — Other Ambulatory Visit: Payer: Self-pay | Admitting: Surgery

## 2015-09-28 ENCOUNTER — Encounter: Payer: Self-pay | Admitting: Surgery

## 2015-09-28 NOTE — H&P (Signed)
Heidi [name] 09/28/2015 8:28 AM Location: [facility] Patient #: [id number] DOB: 02/09/1981 Married / Language: Arabic / Race: Refused to Report/Unreported Female  History of Present Illness (Heidi [name] C. Heidi [name]; 09/28/2015 9:53 AM) The patient is a 34 year old female who presents with abdominal pain. Note for "Abdominal pain": Patient sent for surgical reevaluation for concern of abdominal pain and possible hernia. Patient had an umbilical hernia in the setting of diastases recti and obesity. Laparoscopic repair with 15 x 10 cm periumbilical mesh. May 2014 by Dr Blackman. She was complaining of pain. CT scan done with concern for possible hernia recurrence versus diastases. She saw Dr. Blackman a few months ago. He did not feel that she had a recurrent hernia. Apparently the pain persisted. She thinks she feels a lump now. She sent back to us again. I think she wishes a second opinion, therefore coming to me.  Patient comes today with her husband. Apparently she required emergent surgery in [place] last year. Was told she had a recurrent hernia. Was told that they can only do sutures. She is anxious that the hernias come back. Getting pain and bulging around her belly button. She does struggle with severe constipation. On chronic laxatives now. She seems to move her bowels every day - otherwise it will take several weeks if she does not take anything. She does not smoke. She is not diabetic. Her weight has been stable. No fevers or chills. She did have 2 C-sections in the past. Had an anal fissure surgery. She's had the elective laparoscopic hernia repair with mesh in 2014 & emergent open repair with sutures only last year.   Arabic but speaks English fluently. Originally from [place].     MEDICAL RECORD NO.: [id number]  LOCATION: 1528 FACILITY: [facility]  PHYSICIAN: Douglas Blackman, M.D. DATE OF BIRTH: 06/26/1981   DATE OF  PROCEDURE: 12/20/2012 DATE OF DISCHARGE:  OPERATIVE REPORT   PREOPERATIVE DIAGNOSIS: Umbilical hernia.  POSTOPERATIVE DIAGNOSIS: Umbilical hernia.  PROCEDURE: Laparoscopic umbilical hernia repair with mesh.  SURGEON: Douglas Blackman, M.D.  ANESTHESIA: General and 0.5% Marcaine.  ESTIMATED BLOOD LOSS: Minimal.  INDICATION: This is a 31-year-old female who has a symptomatic hernia at the umbilicus. 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 I explained to the family that I could repair the umbilical hernia in which she would need a tummy tuck surgery to fix the laxity of her abdominal wall and redundant skin. At this point, they elected to go ahead and proceed with laparoscopic umbilical hernia repair with mesh.  FINDINGS: The patient was found to have a small fascial defect at the umbilicus, as well as some splaying of the fascia just below this. I repaired this with a piece of 10 cm x 15 cm Physiomesh.   Allergies (Heidi [name], CMA; 09/28/2015 8:28 AM) Vancomycin HCl *ANTI-INFECTIVE AGENTS - MISC.*  Medication History (Heidi [name], CMA; 09/28/2015 8:28 AM) TraMADol HCl (50MG Tablet, 1 (one) Tablet Oral every six hours, as needed, Taken starting 05/15/2015) Active. Naproxen (500MG Tablet, Oral) Active. Tylenol (500MG Capsule, Oral) Active. Colace (100MG Capsule, Oral) Active. Sprintec 28 (0.25-35MG-MCG Tablet, Oral) Active. Medications Reconciled    Vitals (Heidi [name] CMA; 09/28/2015 8:29 AM) 09/28/2015 8:28 AM Weight: 211 lb Height: 63in Body Surface Area: 1.98 m Body Mass Index: 37.38 kg/m  Temp.: 98.2F(Temporal)  Pulse: 94 (Regular)  BP: 130/62 (Sitting, Left Arm, Standard)      Physical Exam ([name] C. Marinna Blane [name]; 09/28/2015 9:32 AM)  General   Mental Status-Alert. General Appearance-Not in acute distress, Not Sickly. Orientation-Oriented X3. Hydration-Well  hydrated. Voice-Normal.  Integumentary Global Assessment Upon inspection and palpation of skin surfaces of the - Axillae: non-tender, no inflammation or ulceration, no drainage. and Distribution of scalp and body hair is normal. General Characteristics Temperature - normal warmth is noted.  Head and Neck Head-normocephalic, atraumatic with no lesions or palpable masses. Face Global Assessment - atraumatic, no absence of expression. Neck Global Assessment - no abnormal movements, no bruit auscultated on the right, no bruit auscultated on the left, no decreased range of motion, non-tender. Trachea-midline. Thyroid Gland Characteristics - non-tender.  Eye Eyeball - Left-Extraocular movements intact, No Nystagmus. Eyeball - Right-Extraocular movements intact, No Nystagmus. Cornea - Left-No Hazy. Cornea - Right-No Hazy. Sclera/Conjunctiva - Left-No scleral icterus, No Discharge. Sclera/Conjunctiva - Right-No scleral icterus, No Discharge. Pupil - Left-Direct reaction to light normal. Pupil - Right-Direct reaction to light normal.  ENMT Ears Pinna - Left - no drainage observed, no generalized tenderness observed. Right - no drainage observed, no generalized tenderness observed. Nose and Sinuses External Inspection of the Nose - no destructive lesion observed. Inspection of the nares - Left - quiet respiration. Right - quiet respiration. Mouth and Throat Lips - Upper Lip - no fissures observed, no pallor noted. Lower Lip - no fissures observed, no pallor noted. Nasopharynx - no discharge present. Oral Cavity/Oropharynx - Tongue - no dryness observed. Oral Mucosa - no cyanosis observed. Hypopharynx - no evidence of airway distress observed.  Chest and Lung Exam Inspection Movements - Normal and Symmetrical. Accessory muscles - No use of accessory muscles in breathing. Palpation Palpation of the chest reveals - Non-tender. Auscultation Breath sounds - Normal  and Clear.  Cardiovascular Auscultation Rhythm - Regular. Murmurs & Other Heart Sounds - Auscultation of the heart reveals - No Murmurs and No Systolic Clicks.  Abdomen Inspection Inspection of the abdomen reveals - No Visible peristalsis and No Abnormal pulsations. Umbilicus - No Bleeding, No Urine drainage. Palpation/Percussion Palpation and Percussion of the abdomen reveal - Soft, Non Tender, No Rebound tenderness, No Rigidity (guarding) and No Cutaneous hyperesthesia. Note: Morbidly obese but soft. Moderate suprapubic umbilical diastases recti. Midline incision. Sensitivity at bellybutton with a few small Swiss cheese type hernias felt. Point of tenderness. Reducible.  Female Genitourinary Sexual Maturity Tanner 5 - Adult hair pattern. Note: No vaginal bleeding nor discharge. Good hygiene. Mild panniculus. No inguinal hernias.  Peripheral Vascular Upper Extremity Inspection - Left - No Cyanotic nailbeds, Not Ischemic. Right - No Cyanotic nailbeds, Not Ischemic.  Neurologic Neurologic evaluation reveals -normal attention span and ability to concentrate, able to name objects and repeat phrases. Appropriate fund of knowledge , normal sensation and normal coordination. Mental Status Affect - not angry, not paranoid. Cranial Nerves-Normal Bilaterally. Gait-Normal.  Neuropsychiatric Mental status exam performed with findings of-able to articulate well with normal speech/language, rate, volume and coherence, thought content normal with ability to perform basic computations and apply abstract reasoning and no evidence of hallucinations, delusions, obsessions or homicidal/suicidal ideation.  Musculoskeletal Global Assessment Spine, Ribs and Pelvis - no instability, subluxation or laxity. Right Upper Extremity - no instability, subluxation or laxity.  Lymphatic Head & Neck  General Head & Neck Lymphatics: Bilateral - Description - No Localized  lymphadenopathy. Axillary  General Axillary Region: Bilateral - Description - No Localized lymphadenopathy. Femoral & Inguinal  Generalized Femoral & Inguinal Lymphatics: Left - Description - No Localized lymphadenopathy. Right - Description - No Localized lymphadenopathy.    Assessment &   Plan (Lyndzie Zentz C. 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 I think she would benefit from diagnostic laparoscopy with probable larger underlay hernia repair with mesh given her diastases and obesity. She wondered if she had should have an open approach instead. I am a little where it going back and through an open incision. Would start with a camera first is that we'll not burning any bridges. I think she will just need a giant sheet of mesh this time around.  She was tearful and anxious about another operation but is more skirt about another episode of incarceration requiring emergency surgery as happened last year and [place].  I do feel like her chronic constipation and her obesity her significant risk factors. She is trying to lose weight. Her constipation is under much better control with help of the laxatives. Sounds like she is using MiraLAX and Colace. That seems to correlate with what Dr. McIntyre had given her last month.  ENCOUNTER FOR PRE-OPERATIVE EXAMINATION - [facility] (Z01.818)  Current Plans You are being scheduled for surgery - Our schedulers will call you.  You should hear from our office's scheduling department within 5 working days about the location, date, and time of surgery. We try to make accommodations for patient's preferences in scheduling surgery, but sometimes the OR schedule or the surgeon's schedule prevents us from making those accommodations.  If you have not heard from our office ([phone]) in 5 working days, call the office and ask for your surgeon's  nurse.  If you have other questions about your diagnosis, plan, or surgery, call the office and ask for your surgeon's nurse.  Written instructions provided The anatomy & physiology of the abdominal wall was discussed. The pathophysiology of hernias was discussed. Natural history risks without surgery including progeressive enlargement, pain, incarceration, & strangulation was discussed. Contributors to complications such as smoking, obesity, diabetes, prior surgery, etc were discussed.  I feel the risks of no intervention will lead to serious problems that outweigh the operative risks; therefore, I recommended surgery to reduce and repair the hernia. I explained laparoscopic techniques with possible need for an open approach. I noted the probable use of mesh to patch and/or buttress the hernia repair  Risks such as bleeding, infection, abscess, need for further treatment, heart attack, death, and other risks were discussed. I noted a good likelihood this will help address the problem. Goals of post-operative recovery were discussed as well. Possibility that this will not correct all symptoms was explained. I stressed the importance of low-impact activity, aggressive pain control, avoiding constipation, & not pushing through pain to minimize risk of post-operative chronic pain or injury. Possibility of reherniation especially with smoking, obesity, diabetes, immunosuppression, and other health conditions was discussed. We will work to minimize complications.  An educational handout further explaining the pathology & treatment options was given as well. Questions were answered. The patient expresses understanding & wishes to proceed with surgery.  Pt Education - Pamphlet Given - Laparoscopic Hernia Repair: discussed with patient and provided information. Pt Education - [facility] Hernia Post-Op HCI (Jerrol Helmers): discussed with patient and provided information. 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 Therefore would plan larger sheet of mesh to help minimize the risk of recurrence  Current Plans Pt Education - [facility] Diastasis Recti:   discussed with patient and provided information. CHRONIC CONSTIPATION (K59.09) Impression: Definitely a risk factor for hernia recurrence. Seems to be under better control. Continue bowel regimen. Goal is one soft bowel movement a day.  Current Plans Pt Education - [facility] Constipation (AT) Pt Education - [facility] Good Bowel Health (Venita Seng) 

## 2015-10-08 ENCOUNTER — Encounter (HOSPITAL_COMMUNITY): Payer: Self-pay | Admitting: *Deleted

## 2015-10-08 ENCOUNTER — Emergency Department (HOSPITAL_COMMUNITY)
Admission: EM | Admit: 2015-10-08 | Discharge: 2015-10-08 | Disposition: A | Discharge status: Home / Self Care | Payer: Self-pay | Attending: Emergency Medicine | Admitting: Emergency Medicine

## 2015-10-08 ENCOUNTER — Encounter: Payer: Self-pay | Admitting: Family Medicine

## 2015-10-08 ENCOUNTER — Ambulatory Visit (INDEPENDENT_AMBULATORY_CARE_PROVIDER_SITE_OTHER): Payer: No Typology Code available for payment source | Admitting: Family Medicine

## 2015-10-08 ENCOUNTER — Emergency Department (HOSPITAL_COMMUNITY): Payer: Self-pay

## 2015-10-08 VITALS — BP 136/68 | HR 102 | Temp 98.5°F | Ht 63.0 in | Wt 209.4 lb

## 2015-10-08 DIAGNOSIS — R05 Cough: Secondary | ICD-10-CM

## 2015-10-08 DIAGNOSIS — Z8719 Personal history of other diseases of the digestive system: Secondary | ICD-10-CM | POA: Insufficient documentation

## 2015-10-08 DIAGNOSIS — J069 Acute upper respiratory infection, unspecified: Secondary | ICD-10-CM | POA: Insufficient documentation

## 2015-10-08 DIAGNOSIS — R059 Cough, unspecified: Secondary | ICD-10-CM

## 2015-10-08 DIAGNOSIS — K429 Umbilical hernia without obstruction or gangrene: Secondary | ICD-10-CM

## 2015-10-08 DIAGNOSIS — R000 Tachycardia, unspecified: Secondary | ICD-10-CM | POA: Insufficient documentation

## 2015-10-08 LAB — CBC WITH DIFFERENTIAL/PLATELET
BASOS ABS: 0 10*3/uL (ref 0.0–0.1)
Basophils Relative: 0 %
EOS ABS: 0.1 10*3/uL (ref 0.0–0.7)
EOS PCT: 1 %
HCT: 43.4 % (ref 36.0–46.0)
HEMOGLOBIN: 14.1 g/dL (ref 12.0–15.0)
LYMPHS PCT: 16 %
Lymphs Abs: 1.3 10*3/uL (ref 0.7–4.0)
MCH: 28 pg (ref 26.0–34.0)
MCHC: 32.5 g/dL (ref 30.0–36.0)
MCV: 86.3 fL (ref 78.0–100.0)
Monocytes Absolute: 0.7 10*3/uL (ref 0.1–1.0)
Monocytes Relative: 9 %
NEUTROS PCT: 74 %
Neutro Abs: 5.8 10*3/uL (ref 1.7–7.7)
PLATELETS: 190 10*3/uL (ref 150–400)
RBC: 5.03 MIL/uL (ref 3.87–5.11)
RDW: 13.1 % (ref 11.5–15.5)
WBC: 7.9 10*3/uL (ref 4.0–10.5)

## 2015-10-08 LAB — BASIC METABOLIC PANEL
ANION GAP: 12 (ref 5–15)
BUN: 7 mg/dL (ref 6–20)
CHLORIDE: 107 mmol/L (ref 101–111)
CO2: 23 mmol/L (ref 22–32)
Calcium: 9.3 mg/dL (ref 8.9–10.3)
Creatinine, Ser: 0.66 mg/dL (ref 0.44–1.00)
GFR calc Af Amer: >60 mL/min (ref >60)
Glucose, Bld: 87 mg/dL (ref 65–99)
POTASSIUM: 3.8 mmol/L (ref 3.5–5.1)
SODIUM: 142 mmol/L (ref 135–145)

## 2015-10-08 LAB — RAPID STREP SCREEN (MED CTR MEBANE ONLY): Streptococcus, Group A Screen (Direct): NEGATIVE

## 2015-10-08 MED ORDER — METOCLOPRAMIDE HCL 5 MG/ML IJ SOLN
10.0000 mg | Freq: Once | INTRAMUSCULAR | Status: AC
  Administered 2015-10-08: 10 mg via INTRAVENOUS
  Filled 2015-10-08: qty 2

## 2015-10-08 MED ORDER — SODIUM CHLORIDE 0.9 % IV BOLUS (SEPSIS)
1000.0000 mL | Freq: Once | INTRAVENOUS | Status: AC
  Administered 2015-10-08: 1000 mL via INTRAVENOUS

## 2015-10-08 MED ORDER — DIPHENHYDRAMINE HCL 50 MG/ML IJ SOLN
12.5000 mg | Freq: Once | INTRAMUSCULAR | Status: AC
  Administered 2015-10-08: 12.5 mg via INTRAVENOUS
  Filled 2015-10-08: qty 1

## 2015-10-08 MED ORDER — BENZONATATE 100 MG PO CAPS: 100.0000 mg | ORAL_CAPSULE | Freq: Three times a day (TID) | ORAL | Status: DC

## 2015-10-08 NOTE — Discharge Instructions (Signed)
1. Medications: tessalon for cough, tylenol and motrin for headache usual home medications 2. Treatment: rest, drink plenty of fluids; try warm honey, tea, throat lozenges for additional symptom relief 3. Follow Up: please followup with your primary doctor for discussion of your diagnoses and further evaluation after today's visit; please return to the ER for high fever, new or worsening symptoms   Upper Respiratory Infection, Adult Most upper respiratory infections (URIs) are caused by a virus. A URI affects the nose, throat, and upper air passages. The most common type of URI is often called "the common cold." HOME CARE   Take medicines only as told by your doctor.  Gargle warm saltwater or take cough drops to comfort your throat as told by your doctor.  Use a warm mist humidifier or inhale steam from a shower to increase air moisture. This may make it easier to breathe.  Drink enough fluid to keep your pee (urine) clear or pale yellow.  Eat soups and other clear broths.  Have a healthy diet.  Rest as needed.  Go back to work when your fever is gone or your doctor says it is okay.  You may need to stay home longer to avoid giving your URI to others.  You can also wear a face mask and wash your hands often to prevent spread of the virus.  Use your inhaler more if you have asthma.  Do not use any tobacco products, including cigarettes, chewing tobacco, or electronic cigarettes. If you need help quitting, ask your doctor. GET HELP IF:  You are getting worse, not better.  Your symptoms are not helped by medicine.  You have chills.  You are getting more short of breath.  You have brown or red mucus.  You have yellow or brown discharge from your nose.  You have pain in your face, especially when you bend forward.  You have a fever.  You have puffy (swollen) neck glands.  You have pain while swallowing.  You have white areas in the back of your throat. GET HELP RIGHT  AWAY IF:   You have very bad or constant:  Headache.  Ear pain.  Pain in your forehead, behind your eyes, and over your cheekbones (sinus pain).  Chest pain.  You have long-lasting (chronic) lung disease and any of the following:  Wheezing.  Long-lasting cough.  Coughing up blood.  A change in your usual mucus.  You have a stiff neck.  You have changes in your:  Vision.  Hearing.  Thinking.  Mood. MAKE SURE YOU:   Understand these instructions.  Will watch your condition.  Will get help right away if you are not doing well or get worse.   This information is not intended to replace advice given to you by your health care provider. Make sure you discuss any questions you have with your health care provider.   Document Released: 01/14/2008 Document Revised: 12/12/2014 Document Reviewed: 11/02/2013 Elsevier Interactive Patient Education [employer]

## 2015-10-08 NOTE — ED Notes (Signed)
Pt with cough, sore throat and headache x 3 days, worse today.

## 2015-10-08 NOTE — ED Provider Notes (Signed)
CSN: [id number]     Arrival date & time 10/08/15  1750 History  By signing my name below, I, Soijett Blue, attest that this documentation has been prepared under the direction and in the presence of Glean Hess, PA-C Electronically Signed: Soijett Blue, ED Scribe. 10/08/2015. 7:15 PM.   Chief Complaint  Patient presents with  . URI    The history is provided by the patient. No language interpreter was used.    Heidi [name] is a 35 y.o. female who presents to the Emergency Department complaining of 10/10 constant HA onset 3 days. Pt HA is worsened with light and noise. She notes that her current HA is typical of her past HA. She states that she is having associated symptoms of sore throat, chest congestion, non-productive cough, and subjective fever. She states that she has tried tylenol with no relief for her symptoms. She denies chills, dizziness, lightheadedness, vision changes, abdominal pain, and any other symptoms.    Past Medical History  Diagnosis Date  . No pertinent past medical history   . Umbilical hernia   . Headache(784.0)   . Shortness of breath    Past Surgical History  Procedure Laterality Date  . Cesarean section  05/15/2012  . Anal fissure repair  08/16/2009  . Cesarean section  12/03/2010  . Umbilical hernia repair N/A 12/20/2012    Procedure: LAPAROSCOPIC UMBILICAL HERNIA;  Surgeon: Shelly Rubenstein, MD;  Location: [facility];  Service: General;  Laterality: N/A;  . Insertion of mesh N/A 12/20/2012    Procedure: INSERTION OF MESH;  Surgeon: Shelly Rubenstein, MD;  Location: [facility];  Service: General;  Laterality: N/A;  . Hernia repair     No family history on file. Social History  Substance Use Topics  . Smoking status: Never Smoker   . Smokeless tobacco: None  . Alcohol Use: No   OB History    Gravida Para Term Preterm AB TAB SAB Ectopic Multiple Living   2 2 2       2       Review of Systems  Constitutional: Positive for fever (subjective).  Negative for chills.  HENT: Positive for sore throat. Negative for congestion.   Respiratory: Positive for cough.   Gastrointestinal: Negative for abdominal pain.  Neurological: Positive for headaches.      Allergies  Vancomycin  Home Medications   Prior to Admission medications   Medication Sig Start Date End Date Taking? Authorizing Provider  ibuprofen (ADVIL,MOTRIN) 200 MG tablet Take 200-800 mg by mouth every 6 (six) hours as needed for moderate pain.   Yes Historical Provider, MD  benzonatate (TESSALON) 100 MG capsule Take 1 capsule (100 mg total) by mouth every 8 (eight) hours. 10/08/15   Mady Gemma, PA-C  docusate sodium (COLACE) 100 MG capsule Take 1 capsule (100 mg total) by mouth every 12 (twelve) hours. Patient not taking: Reported on 10/05/2015 08/28/15   Latrelle Dodrill, MD  polyethylene glycol powder [facility]) powder Take 17 g by mouth 3 (three) times daily as needed for moderate constipation. Patient not taking: Reported on 10/05/2015 08/28/15   Latrelle Dodrill, MD    BP 112/64 mmHg  Pulse 110  Temp(Src) 98.7 F (37.1 C) (Oral)  Resp 18  SpO2 99%  LMP 09/14/2015 Physical Exam  Constitutional: She is oriented to person, place, and time. She appears well-developed and well-nourished. No distress.  HENT:  Head: Normocephalic and atraumatic.  Right Ear: External ear normal.  Left Ear: External ear  normal.  Nose: Nose normal.  Mouth/Throat: Uvula is midline, oropharynx is clear and moist and mucous membranes are normal.  Eyes: Conjunctivae, EOM and lids are normal. Pupils are equal, round, and reactive to light. Right eye exhibits no discharge. Left eye exhibits no discharge. No scleral icterus.  Neck: Normal range of motion. Neck supple.  Cardiovascular: Normal rate, regular rhythm, normal heart sounds, intact distal pulses and normal pulses.   Pulmonary/Chest: Effort normal and breath sounds normal. No respiratory distress. She has no wheezes. She  has no rales.  Abdominal: Soft. Normal appearance and bowel sounds are normal. She exhibits no distension and no mass. There is no tenderness. There is no rigidity, no rebound and no guarding.  Musculoskeletal: Normal range of motion. She exhibits no edema or tenderness.  Neurological: She is alert and oriented to person, place, and time. She has normal strength. No cranial nerve deficit or sensory deficit.  Skin: Skin is warm, dry and intact. No rash noted. She is not diaphoretic. No erythema. No pallor.  Psychiatric: She has a normal mood and affect. Her speech is normal and behavior is normal.  Nursing note and vitals reviewed.   ED Course  Procedures (including critical care time)  DIAGNOSTIC STUDIES: Oxygen Saturation is 97% on RA, nl by my interpretation.    COORDINATION OF CARE: 7:14 PM Discussed treatment plan with pt at bedside which includes rapid strep screen and culture, CXR, labs and pt agreed to plan.   Labs Review Labs Reviewed  RAPID STREP SCREEN (NOT AT [facility])  CULTURE, GROUP A STREP ([facility])  CBC WITH DIFFERENTIAL/PLATELET  BASIC METABOLIC PANEL  POC URINE PREG, ED    Imaging Review Dg Chest 2 View  10/08/2015  CLINICAL DATA:  Constant headache for 3 days along with sore throat, chest congestion and nonproductive cough. EXAM: CHEST  2 VIEW COMPARISON:  10/29/2012. FINDINGS: The cardiac silhouette, mediastinal hilar contours are within normal limits and stable. Lungs are clear. No pleural effusion. Bony thorax is intact. IMPRESSION: No acute cardiopulmonary findings. Electronically Signed   By: Rudie Meyer M.D.   On: 10/08/2015 19:49   I have personally reviewed and evaluated these images and lab results as part of my medical decision-making.   EKG Interpretation None      MDM   Final diagnoses:  URI (upper respiratory infection)    35 year old female presents with headache, sore throat, and cough x 3 days. States she has a history of headaches and that  her symptoms feel characteristic of her usual headache. Patient is afebrile. Tachycardic. Normal neuro exam with no focal deficit. Patient ambulates without difficulty. No nuchal rigidity. Posterior oropharynx without erythema, edema, or exudate. Heart regular rhythm. Lungs clear to auscultation bilaterally. Abdomen soft, nontender, nondistended. Rapid strep negative. Labs and imaging pending. Will give fluids, reglan, benadryl for headache. On reassessment of patient, she reports significant improvement in symptoms. CBC negative for leukocytosis or anemia. BMP unremarkable. Chest x-ray no active cardiopulmonary disease. Patient is nontoxic and well-appearing, feel she is stable for discharge at this time. Symptoms likely viral. Will give tessalon for cough for home. Advised patient to take tylenol or motrin for headache. Patient to follow up with PCP this week. Return precautions discussed. Patient verbalizes her understanding and is in agreement with plan.  BP 112/64 mmHg  Pulse 110  Temp(Src) 98.7 F (37.1 C) (Oral)  Resp 18  SpO2 99%  LMP 09/14/2015  I personally performed the services described in this documentation, which was scribed  in my presence. The recorded information has been reviewed and is accurate.   Mady Gemma, PA-C 10/08/15 2240  Pricilla Loveless, MD 10/09/15 [phone]

## 2015-10-08 NOTE — Patient Instructions (Signed)
Use tylenol, ibuprofen mucinex - buy this over the counter Drink plenty of liquids Follow up if not better later this week or if getting worse Go get chest xray  Be well, Dr. Pollie Meyer   Upper Respiratory Infection, Adult Most upper respiratory infections (URIs) are a viral infection of the air passages leading to the lungs. A URI affects the nose, throat, and upper air passages. The most common type of URI is nasopharyngitis and is typically referred to as "the common cold." URIs run their course and usually go away on their own. Most of the time, a URI does not require medical attention, but sometimes a bacterial infection in the upper airways can follow a viral infection. This is called a secondary infection. Sinus and middle ear infections are common types of secondary upper respiratory infections. Bacterial pneumonia can also complicate a URI. A URI can worsen asthma and chronic obstructive pulmonary disease (COPD). Sometimes, these complications can require emergency medical care and may be life threatening.  CAUSES Almost all URIs are caused by viruses. A virus is a type of germ and can spread from one person to another.  RISKS FACTORS You may be at risk for a URI if:   You smoke.   You have chronic heart or lung disease.  You have a weakened defense (immune) system.   You are very young or very old.   You have nasal allergies or asthma.  You work in crowded or poorly ventilated areas.  You work in health care facilities or schools. SIGNS AND SYMPTOMS  Symptoms typically develop 2-3 days after you come in contact with a cold virus. Most viral URIs last 7-10 days. However, viral URIs from the influenza virus (flu virus) can last 14-18 days and are typically more severe. Symptoms may include:   Runny or stuffy (congested) nose.   Sneezing.   Cough.   Sore throat.   Headache.   Fatigue.   Fever.   Loss of appetite.   Pain in your forehead, behind your  eyes, and over your cheekbones (sinus pain).  Muscle aches.  DIAGNOSIS  Your health care provider may diagnose a URI by:  Physical exam.  Tests to check that your symptoms are not due to another condition such as:  Strep throat.  Sinusitis.  Pneumonia.  Asthma. TREATMENT  A URI goes away on its own with time. It cannot be cured with medicines, but medicines may be prescribed or recommended to relieve symptoms. Medicines may help:  Reduce your fever.  Reduce your cough.  Relieve nasal congestion. HOME CARE INSTRUCTIONS   Take medicines only as directed by your health care provider.   Gargle warm saltwater or take cough drops to comfort your throat as directed by your health care provider.  Use a warm mist humidifier or inhale steam from a shower to increase air moisture. This may make it easier to breathe.  Drink enough fluid to keep your urine clear or pale yellow.   Eat soups and other clear broths and maintain good nutrition.   Rest as needed.   Return to work when your temperature has returned to normal or as your health care provider advises. You may need to stay home longer to avoid infecting others. You can also use a face mask and careful hand washing to prevent spread of the virus.  Increase the usage of your inhaler if you have asthma.   Do not use any tobacco products, including cigarettes, chewing tobacco, or electronic cigarettes. If  you need help quitting, ask your health care provider. PREVENTION  The best way to protect yourself from getting a cold is to practice good hygiene.   Avoid oral or hand contact with people with cold symptoms.   Wash your hands often if contact occurs.  There is no clear evidence that vitamin C, vitamin E, echinacea, or exercise reduces the chance of developing a cold. However, it is always recommended to get plenty of rest, exercise, and practice good nutrition.  SEEK MEDICAL CARE IF:   You are getting worse  rather than better.   Your symptoms are not controlled by medicine.   You have chills.  You have worsening shortness of breath.  You have brown or red mucus.  You have yellow or brown nasal discharge.  You have pain in your face, especially when you bend forward.  You have a fever.  You have swollen neck glands.  You have pain while swallowing.  You have white areas in the back of your throat. SEEK IMMEDIATE MEDICAL CARE IF:   You have severe or persistent:  Headache.  Ear pain.  Sinus pain.  Chest pain.  You have chronic lung disease and any of the following:  Wheezing.  Prolonged cough.  Coughing up blood.  A change in your usual mucus.  You have a stiff neck.  You have changes in your:  Vision.  Hearing.  Thinking.  Mood. MAKE SURE YOU:   Understand these instructions.  Will watch your condition.  Will get help right away if you are not doing well or get worse.   This information is not intended to replace advice given to you by your health care provider. Make sure you discuss any questions you have with your health care provider.   Document Released: 01/21/2001 Document Revised: 12/12/2014 Document Reviewed: 11/02/2013 Elsevier Interactive Patient Education [employer].

## 2015-10-11 LAB — CULTURE, GROUP A STREP (THRC)

## 2015-10-12 NOTE — Assessment & Plan Note (Signed)
Has upcoming surgery planned.

## 2015-10-12 NOTE — Progress Notes (Signed)
Date of Visit: 10/08/2015   HPI:  Patient presents to follow up, also to discuss URI symptoms.   For 3 days has had headache, earache, dry cough. Feels cold but unsure about fever. No runny nose, vomiting, diarrhea. No sick contacts. Drinking well but decreased intake of solids.   Has upcoming surgery planned to fix her hernia.   ROS: See HPI.  PMFSH: umbilical hernia, constipation  PHYSICAL EXAM: BP 136/68 mmHg  Pulse 102  Temp(Src) 98.5 F (36.9 C) (Oral)  Ht 5\' 3"  (1.6 m)  Wt 209 lb 6.4 oz (94.983 kg)  BMI 37.10 kg/m2  SpO2 99%  LMP 09/14/2015 Gen: NAD, pleasant, cooperative HEENT: normocephalic, atraumatic. Tympanic membranes clear bilaterally. No anterior cervical or supraclavicular lymphadenopathy.. Moist mucous membranes. Oropharynx clear and moist Heart: regular rate and rhythm, no murmur Lungs: clear to auscultation bilaterally, normal work of breathing. One area of decreased aeration on R side  Neuro: grossly nonfocal., speech normal Ext: No appreciable lower extremity edema bilaterally   ASSESSMENT/PLAN:  Umbilical hernia Has upcoming surgery planned.    URI - appears viral in nature. Well appearing. With focal area of decreased aeration, will check CXR to exclude pneumonia. Otherwise recommend supportive care. Follow up if not improving or worsening.   FOLLOW UP: Follow up as needed if symptoms worsen or fail to improve.    [name] [name], MD [facility]

## 2015-10-15 ENCOUNTER — Encounter (HOSPITAL_COMMUNITY)
Admission: RE | Admit: 2015-10-15 | Discharge: 2015-10-15 | Disposition: A | Discharge status: Home / Self Care | Payer: No Typology Code available for payment source | Source: Ambulatory Visit | Attending: Surgery | Admitting: Surgery

## 2015-10-15 ENCOUNTER — Encounter (HOSPITAL_COMMUNITY): Payer: Self-pay

## 2015-10-15 DIAGNOSIS — K469 Unspecified abdominal hernia without obstruction or gangrene: Secondary | ICD-10-CM | POA: Insufficient documentation

## 2015-10-15 DIAGNOSIS — Z01812 Encounter for preprocedural laboratory examination: Secondary | ICD-10-CM | POA: Insufficient documentation

## 2015-10-15 HISTORY — DX: Epistaxis: R04.0

## 2015-10-15 HISTORY — DX: Personal history of urinary (tract) infections: Z87.440

## 2015-10-15 HISTORY — DX: Dizziness and giddiness: R42

## 2015-10-15 HISTORY — DX: Constipation, unspecified: K59.00

## 2015-10-15 HISTORY — DX: Candidiasis, unspecified: B37.9

## 2015-10-15 LAB — PREGNANCY, URINE: Preg Test, Ur: NEGATIVE

## 2015-10-15 NOTE — Patient Instructions (Signed)
Heidi [name]  10/15/2015   Your procedure is scheduled on: Thursday October 18, 2015  Report [facility]  Entrance [place]  elevators to 3rd floor to  Short Stay Center at 8:00 AM.  Call this number if you have problems the morning of surgery [phone]   Remember: ONLY 1 PERSON MAY GO WITH YOU TO SHORT STAY TO GET  READY MORNING OF YOUR SURGERY.  Do not eat food or drink liquids :After Midnight.     Take these medicines the morning of surgery with A SIP OF WATER: NONE                               You may not have any metal on your body including hair pins and              piercings  Do not wear jewelry, make-up, lotions, powders or perfumes, deodorant             Do not wear nail polish.  Do not shave  48 hours prior to surgery.            Do not bring valuables to the hospital. [facility] IS NOT             RESPONSIBLE   FOR VALUABLES.  Contacts, dentures or bridgework may not be worn into surgery.  Leave suitcase in the car. After surgery it may be brought to your room.     _____________________________________________________________________             [facility] - Preparing for Surgery Before surgery, you can play an important role.  Because skin is not sterile, your skin needs to be as free of germs as possible.  You can reduce the number of germs on your skin by washing with CHG (chlorahexidine gluconate) soap before surgery.  CHG is an antiseptic cleaner which kills germs and bonds with the skin to continue killing germs even after washing. Please DO NOT use if you have an allergy to CHG or antibacterial soaps.  If your skin becomes reddened/irritated stop using the CHG and inform your nurse when you arrive at Short Stay. Do not shave (including legs and underarms) for at least 48 hours prior to the first CHG shower.  You may shave your face/neck. Please follow these instructions carefully:  1.  Shower with CHG Soap the night before  surgery and the  morning of Surgery.  2.  If you choose to wash your hair, wash your hair first as usual with your  normal  shampoo.  3.  After you shampoo, rinse your hair and body thoroughly to remove the  shampoo.                           4.  Use CHG as you would any other liquid soap.  You can apply chg directly  to the skin and wash                       Gently with a scrungie or clean washcloth.  5.  Apply the CHG Soap to your body ONLY FROM THE NECK DOWN.   Do not use on face/ open  Wound or open sores. Avoid contact with eyes, ears mouth and genitals (private parts).                       Wash face,  Genitals (private parts) with your normal soap.             6.  Wash thoroughly, paying special attention to the area where your surgery  will be performed.  7.  Thoroughly rinse your body with warm water from the neck down.  8.  DO NOT shower/wash with your normal soap after using and rinsing off  the CHG Soap.                9.  Pat yourself dry with a clean towel.            10.  Wear clean pajamas.            11.  Place clean sheets on your bed the night of your first shower and do not  sleep with pets. Day of Surgery : Do not apply any lotions/deodorants the morning of surgery.  Please wear clean clothes to the hospital/surgery center.  FAILURE TO FOLLOW THESE INSTRUCTIONS MAY RESULT IN THE CANCELLATION OF YOUR SURGERY PATIENT SIGNATURE_________________________________  NURSE SIGNATURE__________________________________  ________________________________________________________________________

## 2015-10-15 NOTE — Progress Notes (Signed)
CBCD and BMP results in epic 10/08/2015 CXR/epic 10/08/2015

## 2015-10-17 MED ORDER — GENTAMICIN SULFATE 40 MG/ML IJ SOLN
360.0000 mg | INTRAVENOUS | Status: DC
  Administered 2015-10-18: 360 mg via INTRAVENOUS
  Filled 2015-10-17: qty 9

## 2015-10-18 ENCOUNTER — Ambulatory Visit (HOSPITAL_COMMUNITY): Payer: Self-pay | Admitting: Certified Registered Nurse Anesthetist

## 2015-10-18 ENCOUNTER — Inpatient Hospital Stay (HOSPITAL_COMMUNITY)
Admission: RE | Admit: 2015-10-18 | Discharge: 2015-10-21 | DRG: 337 | Disposition: A | Discharge status: Home / Self Care | Payer: Self-pay | Source: Ambulatory Visit | Attending: Surgery | Admitting: Surgery

## 2015-10-18 ENCOUNTER — Encounter (HOSPITAL_COMMUNITY): Payer: Self-pay | Admitting: *Deleted

## 2015-10-18 ENCOUNTER — Encounter (HOSPITAL_COMMUNITY)
Admission: RE | Disposition: A | Discharge status: Home / Self Care | Payer: Self-pay | Source: Ambulatory Visit | Attending: Surgery

## 2015-10-18 DIAGNOSIS — R059 Cough, unspecified: Secondary | ICD-10-CM

## 2015-10-18 DIAGNOSIS — E669 Obesity, unspecified: Secondary | ICD-10-CM

## 2015-10-18 DIAGNOSIS — Z6832 Body mass index (BMI) 32.0-32.9, adult: Secondary | ICD-10-CM

## 2015-10-18 DIAGNOSIS — Z8719 Personal history of other diseases of the digestive system: Secondary | ICD-10-CM

## 2015-10-18 DIAGNOSIS — R05 Cough: Secondary | ICD-10-CM

## 2015-10-18 DIAGNOSIS — F419 Anxiety disorder, unspecified: Secondary | ICD-10-CM

## 2015-10-18 DIAGNOSIS — K660 Peritoneal adhesions (postprocedural) (postinfection): Secondary | ICD-10-CM | POA: Diagnosis present

## 2015-10-18 DIAGNOSIS — K5909 Other constipation: Secondary | ICD-10-CM | POA: Diagnosis present

## 2015-10-18 DIAGNOSIS — Z9889 Other specified postprocedural states: Secondary | ICD-10-CM

## 2015-10-18 DIAGNOSIS — K432 Incisional hernia without obstruction or gangrene: Principal | ICD-10-CM | POA: Diagnosis present

## 2015-10-18 HISTORY — DX: Other complications of anesthesia, initial encounter: T88.59XA

## 2015-10-18 HISTORY — PX: INSERTION OF MESH: SHX5868

## 2015-10-18 HISTORY — DX: Adverse effect of unspecified anesthetic, initial encounter: T41.45XA

## 2015-10-18 HISTORY — PX: VENTRAL HERNIA REPAIR: SHX424

## 2015-10-18 SURGERY — REPAIR, HERNIA, VENTRAL, LAPAROSCOPIC
Anesthesia: General | Site: Abdomen

## 2015-10-18 MED ORDER — IBUPROFEN 200 MG PO TABS: 200.0000 mg | ORAL_TABLET | Freq: Four times a day (QID) | ORAL | Status: DC | PRN

## 2015-10-18 MED ORDER — ONDANSETRON HCL 4 MG/2ML IJ SOLN
INTRAMUSCULAR | Status: AC
Start: 2015-10-18 — End: 2015-10-18
  Filled 2015-10-18: qty 2

## 2015-10-18 MED ORDER — FENTANYL CITRATE (PF) 250 MCG/5ML IJ SOLN
INTRAMUSCULAR | Status: AC
  Filled 2015-10-18: qty 5

## 2015-10-18 MED ORDER — OXYCODONE HCL 5 MG PO TABS: 5.0000 mg | ORAL_TABLET | ORAL | Status: DC | PRN

## 2015-10-18 MED ORDER — ONDANSETRON HCL 4 MG/2ML IJ SOLN
INTRAMUSCULAR | Status: DC | PRN
  Administered 2015-10-18: 4 mg via INTRAVENOUS

## 2015-10-18 MED ORDER — FENTANYL CITRATE (PF) 100 MCG/2ML IJ SOLN: INTRAMUSCULAR | Status: DC | PRN

## 2015-10-18 MED ORDER — BUPIVACAINE LIPOSOME 1.3 % IJ SUSP
INTRAMUSCULAR | Status: DC | PRN
  Administered 2015-10-18: 20 mL

## 2015-10-18 MED ORDER — SODIUM CHLORIDE 0.9 % IV SOLN
8.0000 mg | Freq: Four times a day (QID) | INTRAVENOUS | Status: DC | PRN
  Filled 2015-10-18: qty 4

## 2015-10-18 MED ORDER — CEFAZOLIN SODIUM-DEXTROSE 2-3 GM-% IV SOLR
INTRAVENOUS | Status: AC
  Filled 2015-10-18: qty 50

## 2015-10-18 MED ORDER — 0.9 % SODIUM CHLORIDE (POUR BTL) OPTIME
TOPICAL | Status: DC | PRN
  Administered 2015-10-18: 1000 mL

## 2015-10-18 MED ORDER — SODIUM CHLORIDE 0.9 % IJ SOLN
INTRAMUSCULAR | Status: AC
Start: 2015-10-18 — End: 2015-10-18
  Filled 2015-10-18: qty 100

## 2015-10-18 MED ORDER — ONDANSETRON HCL 4 MG/2ML IJ SOLN: 4.0000 mg | Freq: Four times a day (QID) | INTRAMUSCULAR | Status: DC | PRN

## 2015-10-18 MED ORDER — ACETAMINOPHEN 650 MG RE SUPP: 650.0000 mg | Freq: Four times a day (QID) | RECTAL | Status: DC | PRN

## 2015-10-18 MED ORDER — METOPROLOL TARTRATE 12.5 MG HALF TABLET
12.5000 mg | ORAL_TABLET | Freq: Two times a day (BID) | ORAL | Status: DC | PRN
  Filled 2015-10-18: qty 1

## 2015-10-18 MED ORDER — BUPIVACAINE-EPINEPHRINE 0.25% -1:200000 IJ SOLN
INTRAMUSCULAR | Status: AC
  Filled 2015-10-18: qty 2

## 2015-10-18 MED ORDER — METHOCARBAMOL 1000 MG/10ML IJ SOLN
1000.0000 mg | Freq: Four times a day (QID) | INTRAVENOUS | Status: DC | PRN
  Administered 2015-10-18: 1000 mg via INTRAVENOUS
  Filled 2015-10-18 (×2): qty 10

## 2015-10-18 MED ORDER — HYDROMORPHONE HCL 1 MG/ML IJ SOLN: 0.5000 mg | INTRAMUSCULAR | Status: DC | PRN

## 2015-10-18 MED ORDER — KETOROLAC TROMETHAMINE 30 MG/ML IJ SOLN
INTRAMUSCULAR | Status: AC
Start: 2015-10-18 — End: 2015-10-18
  Filled 2015-10-18: qty 1

## 2015-10-18 MED ORDER — PHENYLEPHRINE HCL 10 MG/ML IJ SOLN
INTRAMUSCULAR | Status: DC | PRN
  Administered 2015-10-18: 40 ug via INTRAVENOUS
  Administered 2015-10-18 (×2): 80 ug via INTRAVENOUS

## 2015-10-18 MED ORDER — DIPHENHYDRAMINE HCL 50 MG/ML IJ SOLN: 12.5000 mg | Freq: Four times a day (QID) | INTRAMUSCULAR | Status: DC | PRN

## 2015-10-18 MED ORDER — BUPIVACAINE-EPINEPHRINE 0.25% -1:200000 IJ SOLN
INTRAMUSCULAR | Status: DC | PRN
  Administered 2015-10-18: 93 mL

## 2015-10-18 MED ORDER — METHOCARBAMOL 750 MG PO TABS: 750.0000 mg | ORAL_TABLET | Freq: Four times a day (QID) | ORAL | Status: DC | PRN

## 2015-10-18 MED ORDER — LACTATED RINGERS IV SOLN
INTRAVENOUS | Status: DC | PRN
  Administered 2015-10-18 (×2): via INTRAVENOUS

## 2015-10-18 MED ORDER — SACCHAROMYCES BOULARDII 250 MG PO CAPS
250.0000 mg | ORAL_CAPSULE | Freq: Two times a day (BID) | ORAL | Status: DC
  Administered 2015-10-18 – 2015-10-21 (×6): 250 mg via ORAL
  Filled 2015-10-18 (×8): qty 1

## 2015-10-18 MED ORDER — HYDROMORPHONE HCL 1 MG/ML IJ SOLN
0.2500 mg | INTRAMUSCULAR | Status: DC | PRN
  Administered 2015-10-18 (×2): 0.5 mg via INTRAVENOUS

## 2015-10-18 MED ORDER — STERILE WATER FOR IRRIGATION IR SOLN
Status: DC | PRN
  Administered 2015-10-18: 1000 mL

## 2015-10-18 MED ORDER — ONDANSETRON 4 MG PO TBDP: 4.0000 mg | ORAL_TABLET | Freq: Four times a day (QID) | ORAL | Status: DC | PRN

## 2015-10-18 MED ORDER — MAGIC MOUTHWASH
15.0000 mL | Freq: Four times a day (QID) | ORAL | Status: DC | PRN
  Filled 2015-10-18: qty 15

## 2015-10-18 MED ORDER — SUCCINYLCHOLINE CHLORIDE 20 MG/ML IJ SOLN
INTRAMUSCULAR | Status: DC | PRN
  Administered 2015-10-18: 140 mg via INTRAVENOUS

## 2015-10-18 MED ORDER — PROPOFOL 10 MG/ML IV BOLUS
INTRAVENOUS | Status: DC | PRN
  Administered 2015-10-18: 150 mg via INTRAVENOUS

## 2015-10-18 MED ORDER — ALBUTEROL SULFATE (2.5 MG/3ML) 0.083% IN NEBU: 2.5000 mg | INHALATION_SOLUTION | Freq: Four times a day (QID) | RESPIRATORY_TRACT | Status: DC | PRN

## 2015-10-18 MED ORDER — ROCURONIUM BROMIDE 100 MG/10ML IV SOLN
INTRAVENOUS | Status: DC | PRN
  Administered 2015-10-18: 50 mg via INTRAVENOUS

## 2015-10-18 MED ORDER — FENTANYL CITRATE (PF) 100 MCG/2ML IJ SOLN
INTRAMUSCULAR | Status: DC | PRN
  Administered 2015-10-18: 50 ug via INTRAVENOUS
  Administered 2015-10-18: 100 ug via INTRAVENOUS
  Administered 2015-10-18: 50 ug via INTRAVENOUS

## 2015-10-18 MED ORDER — SUGAMMADEX SODIUM 200 MG/2ML IV SOLN
INTRAVENOUS | Status: AC
  Filled 2015-10-18: qty 2

## 2015-10-18 MED ORDER — POLYETHYLENE GLYCOL 3350 17 GM/SCOOP PO POWD: 17.0000 g | Freq: Three times a day (TID) | ORAL | Status: DC | PRN

## 2015-10-18 MED ORDER — BENZONATATE 100 MG PO CAPS
100.0000 mg | ORAL_CAPSULE | Freq: Three times a day (TID) | ORAL | Status: DC
Start: 2015-10-18 — End: 2015-10-19
  Administered 2015-10-18 – 2015-10-19 (×2): 100 mg via ORAL
  Filled 2015-10-18 (×5): qty 1

## 2015-10-18 MED ORDER — KETOROLAC TROMETHAMINE 30 MG/ML IJ SOLN
INTRAMUSCULAR | Status: DC | PRN
  Administered 2015-10-18: 30 mg via INTRAVENOUS

## 2015-10-18 MED ORDER — IBUPROFEN 800 MG PO TABS: 800.0000 mg | ORAL_TABLET | Freq: Four times a day (QID) | ORAL | Status: DC

## 2015-10-18 MED ORDER — MAGNESIUM HYDROXIDE 400 MG/5ML PO SUSP
30.0000 mL | Freq: Two times a day (BID) | ORAL | Status: DC | PRN
  Administered 2015-10-19 – 2015-10-20 (×2): 30 mL via ORAL
  Filled 2015-10-18 (×2): qty 30

## 2015-10-18 MED ORDER — ZOLPIDEM TARTRATE 5 MG PO TABS
5.0000 mg | ORAL_TABLET | Freq: Every evening | ORAL | Status: DC | PRN
  Administered 2015-10-19: 5 mg via ORAL
  Filled 2015-10-18: qty 1

## 2015-10-18 MED ORDER — GUAIFENESIN-DM 100-10 MG/5ML PO SYRP: 15.0000 mL | ORAL_SOLUTION | ORAL | Status: DC | PRN

## 2015-10-18 MED ORDER — DEXAMETHASONE SODIUM PHOSPHATE 10 MG/ML IJ SOLN
INTRAMUSCULAR | Status: AC
  Filled 2015-10-18: qty 1

## 2015-10-18 MED ORDER — ROCURONIUM BROMIDE 100 MG/10ML IV SOLN
INTRAVENOUS | Status: AC
  Filled 2015-10-18: qty 1

## 2015-10-18 MED ORDER — METOPROLOL TARTRATE 1 MG/ML IV SOLN: 5.0000 mg | Freq: Four times a day (QID) | INTRAVENOUS | Status: DC | PRN

## 2015-10-18 MED ORDER — ALUM & MAG HYDROXIDE-SIMETH 200-200-20 MG/5ML PO SUSP: 30.0000 mL | Freq: Four times a day (QID) | ORAL | Status: DC | PRN

## 2015-10-18 MED ORDER — ENOXAPARIN SODIUM 40 MG/0.4ML ~~LOC~~ SOLN
40.0000 mg | SUBCUTANEOUS | Status: DC
  Administered 2015-10-19 – 2015-10-21 (×3): 40 mg via SUBCUTANEOUS
  Filled 2015-10-18 (×4): qty 0.4

## 2015-10-18 MED ORDER — LACTATED RINGERS IV SOLN
INTRAVENOUS | Status: DC
  Administered 2015-10-19: 02:00:00 via INTRAVENOUS

## 2015-10-18 MED ORDER — PROPOFOL 10 MG/ML IV BOLUS
INTRAVENOUS | Status: AC
  Filled 2015-10-18: qty 20

## 2015-10-18 MED ORDER — POLYETHYLENE GLYCOL 3350 17 G PO PACK: 17.0000 g | PACK | Freq: Three times a day (TID) | ORAL | Status: DC | PRN

## 2015-10-18 MED ORDER — ACETAMINOPHEN 325 MG PO TABS: 325.0000 mg | ORAL_TABLET | Freq: Four times a day (QID) | ORAL | Status: DC | PRN

## 2015-10-18 MED ORDER — LIDOCAINE HCL (CARDIAC) 20 MG/ML IV SOLN
INTRAVENOUS | Status: AC
  Filled 2015-10-18: qty 5

## 2015-10-18 MED ORDER — CEFAZOLIN SODIUM-DEXTROSE 2-3 GM-% IV SOLR
2.0000 g | INTRAVENOUS | Status: AC
  Administered 2015-10-18: 2 g via INTRAVENOUS

## 2015-10-18 MED ORDER — BUPIVACAINE LIPOSOME 1.3 % IJ SUSP
20.0000 mL | INTRAMUSCULAR | Status: DC
  Filled 2015-10-18: qty 20

## 2015-10-18 MED ORDER — MIDAZOLAM HCL 2 MG/2ML IJ SOLN
INTRAMUSCULAR | Status: AC
  Filled 2015-10-18: qty 2

## 2015-10-18 MED ORDER — MENTHOL 3 MG MT LOZG: 1.0000 | LOZENGE | OROMUCOSAL | Status: DC | PRN

## 2015-10-18 MED ORDER — SODIUM CHLORIDE 0.9 % IJ SOLN
INTRAMUSCULAR | Status: DC | PRN
  Administered 2015-10-18: 80 mL via INTRAVENOUS

## 2015-10-18 MED ORDER — OXYCODONE HCL 5 MG PO TABS
5.0000 mg | ORAL_TABLET | ORAL | Status: DC | PRN
  Administered 2015-10-18: 5 mg via ORAL
  Administered 2015-10-19 (×2): 10 mg via ORAL
  Filled 2015-10-18 (×2): qty 2
  Filled 2015-10-18: qty 1

## 2015-10-18 MED ORDER — ONDANSETRON HCL 4 MG PO TABS: 4.0000 mg | ORAL_TABLET | Freq: Four times a day (QID) | ORAL | Status: DC | PRN

## 2015-10-18 MED ORDER — LIP MEDEX EX OINT
1.0000 "application " | TOPICAL_OINTMENT | Freq: Two times a day (BID) | CUTANEOUS | Status: DC
  Administered 2015-10-18 – 2015-10-21 (×5): 1 via TOPICAL
  Filled 2015-10-18: qty 7

## 2015-10-18 MED ORDER — IBUPROFEN 800 MG PO TABS
800.0000 mg | ORAL_TABLET | Freq: Four times a day (QID) | ORAL | Status: DC
  Administered 2015-10-18: 800 mg via ORAL
  Filled 2015-10-18 (×5): qty 1

## 2015-10-18 MED ORDER — ALBUTEROL SULFATE HFA 108 (90 BASE) MCG/ACT IN AERS
INHALATION_SPRAY | RESPIRATORY_TRACT | Status: DC | PRN
  Administered 2015-10-18: 8 via RESPIRATORY_TRACT

## 2015-10-18 MED ORDER — LIDOCAINE HCL (CARDIAC) 20 MG/ML IV SOLN
INTRAVENOUS | Status: DC | PRN
  Administered 2015-10-18: 60 mg via INTRAVENOUS

## 2015-10-18 MED ORDER — PHENOL 1.4 % MT LIQD: 2.0000 | OROMUCOSAL | Status: DC | PRN

## 2015-10-18 MED ORDER — DOCUSATE SODIUM 100 MG PO CAPS
100.0000 mg | ORAL_CAPSULE | Freq: Two times a day (BID) | ORAL | Status: DC
  Administered 2015-10-18 – 2015-10-21 (×6): 100 mg via ORAL
  Filled 2015-10-18 (×8): qty 1

## 2015-10-18 MED ORDER — ROCURONIUM BROMIDE 100 MG/10ML IV SOLN: INTRAVENOUS | Status: DC | PRN

## 2015-10-18 MED ORDER — HYDROMORPHONE HCL 1 MG/ML IJ SOLN
INTRAMUSCULAR | Status: AC
  Filled 2015-10-18: qty 1

## 2015-10-18 MED ORDER — PROMETHAZINE HCL 25 MG/ML IJ SOLN: 6.2500 mg | INTRAMUSCULAR | Status: DC | PRN

## 2015-10-18 MED ORDER — MEPERIDINE HCL 50 MG/ML IJ SOLN
INTRAMUSCULAR | Status: AC
  Filled 2015-10-18: qty 1

## 2015-10-18 MED ORDER — BISACODYL 10 MG RE SUPP
10.0000 mg | Freq: Every day | RECTAL | Status: DC
  Administered 2015-10-19 – 2015-10-21 (×3): 10 mg via RECTAL
  Filled 2015-10-18 (×3): qty 1

## 2015-10-18 MED ORDER — LORAZEPAM 2 MG/ML IJ SOLN: 0.5000 mg | Freq: Three times a day (TID) | INTRAMUSCULAR | Status: DC | PRN

## 2015-10-18 MED ORDER — METHOCARBAMOL 500 MG PO TABS: 1000.0000 mg | ORAL_TABLET | Freq: Four times a day (QID) | ORAL | Status: DC | PRN

## 2015-10-18 MED ORDER — MIDAZOLAM HCL 5 MG/5ML IJ SOLN
INTRAMUSCULAR | Status: DC | PRN
  Administered 2015-10-18: 2 mg via INTRAVENOUS

## 2015-10-18 MED ORDER — SUGAMMADEX SODIUM 200 MG/2ML IV SOLN
INTRAVENOUS | Status: DC | PRN
  Administered 2015-10-18: 200 mg via INTRAVENOUS

## 2015-10-18 MED ORDER — DEXAMETHASONE SODIUM PHOSPHATE 10 MG/ML IJ SOLN
INTRAMUSCULAR | Status: DC | PRN
  Administered 2015-10-18: 10 mg via INTRAVENOUS

## 2015-10-18 MED ORDER — MEPERIDINE HCL 50 MG/ML IJ SOLN
6.2500 mg | INTRAMUSCULAR | Status: DC | PRN
  Administered 2015-10-18: 12.5 mg via INTRAVENOUS

## 2015-10-18 MED ORDER — LACTATED RINGERS IV BOLUS (SEPSIS): 1000.0000 mL | Freq: Three times a day (TID) | INTRAVENOUS | Status: AC | PRN

## 2015-10-18 SURGICAL SUPPLY — 43 items
APPLIER CLIP 5 13 M/L LIGAMAX5 (MISCELLANEOUS)
BINDER ABDOMINAL 12 ML 46-62 (SOFTGOODS) IMPLANT
CABLE HIGH FREQUENCY MONO STRZ (ELECTRODE) ×4 IMPLANT
CHLORAPREP W/TINT 26ML (MISCELLANEOUS) ×4 IMPLANT
CLIP APPLIE 5 13 M/L LIGAMAX5 (MISCELLANEOUS) IMPLANT
CLOSURE WOUND 1/2 X4 (GAUZE/BANDAGES/DRESSINGS) ×2
COVER SURGICAL LIGHT HANDLE (MISCELLANEOUS) IMPLANT
DECANTER SPIKE VIAL GLASS SM (MISCELLANEOUS) ×8 IMPLANT
DEVICE SECURE STRAP 25 ABSORB (INSTRUMENTS) ×4 IMPLANT
DEVICE TROCAR PUNCTURE CLOSURE (ENDOMECHANICALS) ×4 IMPLANT
DRAPE LAPAROSCOPIC ABDOMINAL (DRAPES) ×4 IMPLANT
DRAPE WARM FLUID 44X44 (DRAPE) ×4 IMPLANT
DRSG TEGADERM 2-3/8X2-3/4 SM (GAUZE/BANDAGES/DRESSINGS) ×4 IMPLANT
DRSG TEGADERM 4X4.75 (GAUZE/BANDAGES/DRESSINGS) ×4 IMPLANT
ELECT PENCIL ROCKER SW 15FT (MISCELLANEOUS) ×4 IMPLANT
ELECT REM PT RETURN 9FT ADLT (ELECTROSURGICAL) ×4
ELECTRODE REM PT RTRN 9FT ADLT (ELECTROSURGICAL) ×2 IMPLANT
GAUZE SPONGE 2X2 8PLY STRL LF (GAUZE/BANDAGES/DRESSINGS) ×2 IMPLANT
GLOVE ECLIPSE 8.0 STRL XLNG CF (GLOVE) ×4 IMPLANT
GLOVE INDICATOR 8.0 STRL GRN (GLOVE) ×4 IMPLANT
GOWN STRL REUS W/TWL XL LVL3 (GOWN DISPOSABLE) ×12 IMPLANT
KIT BASIN OR (CUSTOM PROCEDURE TRAY) ×4 IMPLANT
MARKER SKIN DUAL TIP RULER LAB (MISCELLANEOUS) ×4 IMPLANT
MESH VENTRALIGHT ST 8X10 (Mesh General) ×4 IMPLANT
NEEDLE SPNL 22GX3.5 QUINCKE BK (NEEDLE) IMPLANT
PAD ION RIGHT ARM DISP (MISCELLANEOUS) ×4 IMPLANT
PAD POSITIONING PINK XL (MISCELLANEOUS) ×4 IMPLANT
POSITIONER SURGICAL ARM (MISCELLANEOUS) IMPLANT
SCISSORS LAP 5X35 DISP (ENDOMECHANICALS) ×4 IMPLANT
SET IRRIG TUBING LAPAROSCOPIC (IRRIGATION / IRRIGATOR) IMPLANT
SHEARS HARMONIC ACE PLUS 36CM (ENDOMECHANICALS) IMPLANT
SLEEVE XCEL OPT CAN 5 100 (ENDOMECHANICALS) ×8 IMPLANT
SPONGE GAUZE 2X2 STER 10/PKG (GAUZE/BANDAGES/DRESSINGS) ×2
STRIP CLOSURE SKIN 1/2X4 (GAUZE/BANDAGES/DRESSINGS) ×6 IMPLANT
SUT MNCRL AB 4-0 PS2 18 (SUTURE) ×8 IMPLANT
SUT PDS AB 1 CT1 27 (SUTURE) ×8 IMPLANT
SUT PROLENE 1 CT 1 30 (SUTURE) ×32 IMPLANT
TAPE CLOTH 4X10 WHT NS (GAUZE/BANDAGES/DRESSINGS) IMPLANT
TOWEL OR 17X26 10 PK STRL BLUE (TOWEL DISPOSABLE) ×4 IMPLANT
TRAY LAPAROSCOPIC (CUSTOM PROCEDURE TRAY) ×4 IMPLANT
TROCAR BLADELESS OPT 5 100 (ENDOMECHANICALS) ×4 IMPLANT
TROCAR XCEL NON-BLD 11X100MML (ENDOMECHANICALS) IMPLANT
TUBING INSUF HEATED (TUBING) ×4 IMPLANT

## 2015-10-18 NOTE — Progress Notes (Signed)
Pt states she has had flu like symptoms for past 2 weeks. Has not had a fever in 10 days. Currently has nasal drainage and nonproductive cough as well as sore throat. Consulted IP, and notified Dr Michaell CowingGross and Dr Doretha Sou. [facility] of status.

## 2015-10-18 NOTE — Transfer of Care (Signed)
Immediate Anesthesia Transfer of Care Note  Patient: Heidi [name]  Procedure(s) Performed: Procedure(s): LAPAROSCOPIC LYSIS OF ADHESIONS, REPAIR OF RECURRENT  VENTRAL WALL INCISIONAL HERNIA  WITH MESH (N/A) INSERTION OF MESH (N/A) LAPAROSCOPIC LYSIS OF ADHESIONS  Patient Location: PACU  Anesthesia Type:General  Level of Consciousness: awake  Airway & Oxygen Therapy: Patient connected to face mask oxygen  Post-op Assessment: Report given to RN and Post -op Vital signs reviewed and stable  Post vital signs: Reviewed and stable  Last Vitals:  Filed Vitals:   10/18/15 0809 10/18/15 1313  BP: 118/68   Pulse: 99 116  Temp: 36.8 C   Resp: 18 22    Complications: No apparent anesthesia complications

## 2015-10-18 NOTE — Anesthesia Postprocedure Evaluation (Signed)
Anesthesia Post Note  Patient: Mikayah Garman  Procedure(s) Performed: Procedure(s) (LRB): LAPAROSCOPIC LYSIS OF ADHESIONS, REPAIR OF RECURRENT  VENTRAL WALL INCISIONAL HERNIA  WITH MESH (N/A) INSERTION OF MESH (N/A) LAPAROSCOPIC LYSIS OF ADHESIONS  Patient location during evaluation: PACU Anesthesia Type: General Level of consciousness: awake Pain management: pain level controlled Vital Signs Assessment: post-procedure vital signs reviewed and stable Respiratory status: spontaneous breathing Cardiovascular status: stable Anesthetic complications: no    Last Vitals:  Filed Vitals:   10/18/15 1330 10/18/15 1345  BP: 122/67 114/68  Pulse: 106 106  Temp:    Resp: 15 13    Last Pain:  Filed Vitals:   10/18/15 1354  PainSc: 2                  [facility]WARDS,Darnella Zeiter

## 2015-10-18 NOTE — Op Note (Signed)
10/18/2015  1:06 PM  PATIENT:  Heidi [name]  35 y.o. 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  INDICATION: Pleasant patient status post lap scalp.  Periumbilical ventral wall hernia.  Required emergency surgery [place].  [place]  Has developed an incisional hernia.   Recommendation was made for surgical repair:  The anatomy & physiology of the abdominal wall was discussed. The pathophysiology of hernias was discussed. Natural history risks without surgery including progeressive enlargement, pain, incarceration & strangulation was discussed. Contributors to complications such as smoking, obesity, diabetes, prior surgery, etc were discussed.  I feel the risks of no intervention will lead to serious problems that outweigh the operative risks; therefore, I recommended surgery to reduce and repair the hernia. I explained laparoscopic techniques with possible need for an open approach. I noted the probable use of mesh to patch and/or buttress the hernia repair  Risks such as bleeding, infection, abscess, need for further treatment, heart  attack, death, and other risks were discussed. I noted a good likelihood this will help address the problem. Goals of post-operative recovery were discussed as well. Possibility that this will not correct all symptoms was explained. I stressed the importance of low-impact activity, aggressive pain control, avoiding constipation, & not pushing through pain to minimize risk of post-operative chronic pain or injury. Possibility of reherniation especially with smoking, obesity, diabetes, immunosuppression, and other health conditions was discussed. We will work to minimize complications.  An educational handout further explaining the pathology & treatment options was given as well. Questions were answered. The patient expresses understanding & wishes to proceed with surgery.   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   Type of repair - Laparoscopic underlay repair   Name of mesh - Bard Ventralight dual sided (polypropylene / Seprafilm)  Size of mesh - Height 20 cm, Width 25 cm  Mesh overlap - 6-9 cm  Placement of mesh - Intraperitoneal underlay repair   DESCRIPTION:   Informed consent was confirmed. The patient underwent general anaesthesia without difficulty. The patient was positioned appropriately. VTE prevention in place. The patient's abdomen was clipped, prepped, & draped in a sterile fashion. Surgical timeout confirmed our plan.   The patient was positioned in reverse Trendelenburg. Abdominal entry was gained using optical entry technique in the left upper abdomen. Entry was clean. I induced carbon dioxide insufflation. Camera inspection revealed no injury. Extra ports were carefully placed under direct laparoscopic visualization.   The patient had moderate lesions of greater omentum to the anterior abdominal wall.  She had a quite  stretched out diastatic abdomen.  I did laparoscopic lysis of  adhesions to expose the entire anterior abdominal wall.  I primarily used scissors.  Sometimes with focused cautery.  I made sure hemostasis was good.  Patient had Swiss cheese defects with old mesh partially contracted and eventrated.  8 x 3 cm region.  I mapped out the region using a needle passer.   To ensure that I would have at least 5 cm radial coverage with hernia recurrence and obesity, I chose a 20x25 cm dual sided mesh.  I placed #1 Prolene stitches around its edge about every 5 cm = 14 total.  I rolled the mesh & placed through the hernia through a supraumbilical incision.  I unrolled  the mesh and positioned it appropriately.  I secured the mesh to cover up the hernia defect using a laparoscopic suture passer to pass the tails of the Prolene through the abdominal wall & tagged them with clamps.  I started out in four corners to make sure I had the mesh centered under the hernia defect appropriately, and then proceeded to work in quadrants.  We evacuated CO2 & desufflated the abdomen.  I tied the fascial stitches down.  I debrided out some of the hernia sacs and old mesh that was eventrated around the umbilicus.  I freed the umbilical stalk as it was folded upon itself with an hernia with some incarcerated fat.  From that region.  She had thinned out midline diastatic thinned out fascia related to the Swiss cheese hernias   Trimmed down to more healthy edge.  I closed the fascia using #1 running PDS to good result.  I reinsufflated the abdomen.  The mesh provided at least 5-10 cm circumferential coverage around the entire region of hernia defects.   I tacked the edges & central part of the mesh to the peritoneum/posterior rectus fascia with SecureStrap absorbable tacks.   I also placed #1 Prolene suture through the upper and lower part of the midline fascial closure.  Also right paramedian (medium regions.  This help tack the mesh well as she had a very stretched out abdomen.  Trying to  minimize  eventration risk.  Hemostasis was excellent.  I did reinspection. Hemostasis was good. Mesh laid well. Capnoperitoneum was evacuated.  I done block with quarter percent bupivacaine with epinephrine at the beginning case.  I finished with diluted liposomal bupivacaine at the end of the case.  Ports were removed. The skin was closed with Monocryl at the port sites and Steri-Strips on the fascial stitch puncture sites.  I excised the old midline scar and trimmed back as she had redundant tissue.  I ended up re-tacking the umbilicus a little higher up so as to be more than any cosmetically.  I closed the incision transversely to have more of a supraumbilical transverse incision and cut out redundant thinned out skin.  Cosmetically this seemed to look better.  Sterile dressings applied.  Patient is being extubated to go to the recovery room. I'm about to discuss operative findings with the patient's family.   [name], M.D., F.A.C.S. Gastrointestinal and Minimally Invasive Surgery [facility] [address] [place], [place] [address] [phone] Main / Paging

## 2015-10-18 NOTE — Anesthesia Procedure Notes (Signed)
Procedure Name: Intubation Date/Time: 10/18/2015 10:53 AM Performed by: Renaldo FiddlerHOM, Heidi [name] EVETTE Pre-anesthesia Checklist: Emergency Drugs available, Patient identified, Suction available, Patient being monitored and Timeout performed Patient Re-evaluated:Patient Re-evaluated prior to inductionOxygen Delivery Method: Circle system utilized Preoxygenation: Pre-oxygenation with 100% oxygen Intubation Type: IV induction Ventilation: Mask ventilation without difficulty Laryngoscope Size: Miller and 2 Grade View: Grade I Tube type: Oral Tube size: 7.5 mm Number of attempts: 1 Airway Equipment and Method: Patient positioned with wedge pillow and Stylet Placement Confirmation: ETT inserted through vocal cords under direct vision,  positive ETCO2,  CO2 detector and breath sounds checked- equal and bilateral Secured at: 22 cm Tube secured with: Tape Dental Injury: Teeth and Oropharynx as per pre-operative assessment

## 2015-10-18 NOTE — Anesthesia Preprocedure Evaluation (Signed)
Anesthesia Evaluation  Patient identified by MRN, date of birth, ID band Patient awake    Reviewed: Allergy & Precautions, NPO status , Patient's Chart, lab work & pertinent test results  Airway Mallampati: I  TM Distance: >3 FB Neck ROM: Full    Dental   Pulmonary shortness of breath,  Presently not dyspneic. 02 sat 100%. Discussed case with patient, husband,and interpreter of risks and benefits of surgery and all agreed. CE   breath sounds clear to auscultation       Cardiovascular negative cardio ROS   Rhythm:Regular Rate:Normal     Neuro/Psych    GI/Hepatic negative GI ROS, Neg liver ROS,   Endo/Other  negative endocrine ROS  Renal/GU negative Renal ROS     Musculoskeletal   Abdominal   Peds  Hematology   Anesthesia Other Findings   Reproductive/Obstetrics                             Anesthesia Physical Anesthesia Plan  ASA: II  Anesthesia Plan: General   Post-op Pain Management:    Induction: Intravenous  Airway Management Planned: Oral ETT  Additional Equipment:   Intra-op Plan:   Post-operative Plan: Possible Post-op intubation/ventilation  Informed Consent: I have reviewed the patients History and Physical, chart, labs and discussed the procedure including the risks, benefits and alternatives for the proposed anesthesia with the patient or authorized representative who has indicated his/her understanding and acceptance.   Dental advisory given  Plan Discussed with: CRNA and Anesthesiologist  Anesthesia Plan Comments:         Anesthesia Quick Evaluation

## 2015-10-18 NOTE — Interval H&P Note (Signed)
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 After consideration of risks, benefits and other options for treatment, the patient has consented to  Procedure(s): LAPAROSCOPIC VENTRAL WALL HERNIA REPAIR (N/A) INSERTION OF MESH (N/A) as a surgical intervention .  The patient's history has been reviewed, patient examined, no change in status, stable for surgery.  I have reviewed the patient's chart and labs.  Questions were answered to the patient's satisfaction with Arabic interpreter at bedside   Tayveon Lombardo C.

## 2015-10-18 NOTE — H&P (View-Only) (Signed)
Heidi [name] 09/28/2015 8:28 AM Location: [facility] Patient #: [id number] DOB: [birth year] Married / Language: Arabic / Race: Refused to Report/Unreported Female  History of Present Illness Heidi Sportsman MD; 09/28/2015 9:53 AM) The patient is a 35 year old female who presents with abdominal pain. Note for "Abdominal pain": Patient sent for surgical reevaluation for concern of abdominal pain and possible hernia. Patient had an umbilical hernia in the setting of diastases recti and obesity. Laparoscopic repair with 15 x 10 cm periumbilical mesh. May 2014 by Dr Magnus Ivan. She was complaining of pain. CT scan done with concern for possible hernia recurrence versus diastases. She saw Dr. Magnus Ivan a few months ago. He did not feel that she had a recurrent hernia. Apparently the pain persisted. She thinks she feels a lump now. She sent back to [place] again. I think she wishes a second opinion, therefore coming to me.  Patient comes today with her husband. Apparently she required emergent surgery in [place] last year. Was told she had a recurrent hernia. Was told that they can only do sutures. She is anxious that the hernias come back. Getting pain and bulging around her belly button. She does struggle with severe constipation. On chronic laxatives now. She seems to move her bowels every day - otherwise it will take several weeks if she does not take anything. She does not smoke. She is not diabetic. Her weight has been stable. No fevers or chills. She did have 2 C-sections in the past. Had an anal fissure surgery. She's had the elective laparoscopic hernia repair with mesh in 2014 & emergent open repair with sutures only last year.   Arabic but speaks [place] fluently. Originally from [place].     MEDICAL RECORD NO.: [id number]  LOCATION: 1528 FACILITY: [facility]  PHYSICIAN: Abigail Miyamoto, M.D. DATE OF BIRTH: July 19, 1981   DATE OF  PROCEDURE: 12/20/2012 DATE OF DISCHARGE:  OPERATIVE REPORT   PREOPERATIVE DIAGNOSIS: Umbilical hernia.  POSTOPERATIVE DIAGNOSIS: Umbilical hernia.  PROCEDURE: Laparoscopic umbilical hernia repair with mesh.  SURGEON: Abigail Miyamoto, M.D.  ANESTHESIA: General and 0.5% Marcaine.  ESTIMATED BLOOD LOSS: Minimal.  INDICATION: This is a 35 year old female who has a symptomatic hernia at the umbilicus. 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 I explained to the family that I could repair the umbilical hernia in which she would need a tummy tuck surgery to fix the laxity of her abdominal wall and redundant skin. At this point, they elected to go ahead and proceed with laparoscopic umbilical hernia repair with mesh.  FINDINGS: The patient was found to have a small fascial defect at the umbilicus, as well as some splaying of the fascia just below this. I repaired this with a piece of 10 cm x 15 cm Physiomesh.   Allergies Fay Records, CMA; 09/28/2015 8:28 AM) Vancomycin HCl *ANTI-INFECTIVE AGENTS - MISC.*  Medication History Fay Records, CMA; 09/28/2015 8:28 AM) TraMADol HCl (  Tablet, 1 (one) Tablet Oral every six hours, as needed, Taken starting 05/15/2015) Active. Naproxen (  Tablet, Oral) Active. Tylenol (  Capsule, Oral) Active. Colace (  Capsule, Oral) Active. Sprintec 28 (0.25-35MG -MCG Tablet, Oral) Active. Medications Reconciled    Vitals Fay Records CMA; 09/28/2015 8:29 AM) 09/28/2015 8:28 AM Weight: 211 lb Height: 63in Body Surface Area: 1.98 m Body Mass Index: 37.38 kg/m  Temp.: 98.6F(Temporal)  Pulse: 94 (Regular)  BP: 130/62 (Sitting, Left Arm, Standard)      Physical Exam Heidi Sportsman MD; 09/28/2015 9:32 AM)  General  Mental Status-Alert. General Appearance-Not in acute distress, Not Sickly. Orientation-Oriented X3. Hydration-Well  hydrated. Voice-Normal.  Integumentary Global Assessment Upon inspection and palpation of skin surfaces of the - Axillae: non-tender, no inflammation or ulceration, no drainage. and Distribution of scalp and body hair is normal. General Characteristics Temperature - normal warmth is noted.  Head and Neck Head-normocephalic, atraumatic with no lesions or palpable masses. Face Global Assessment - atraumatic, no absence of expression. Neck Global Assessment - no abnormal movements, no bruit auscultated on the right, no bruit auscultated on the left, no decreased range of motion, non-tender. Trachea-midline. Thyroid Gland Characteristics - non-tender.  Eye Eyeball - Left-Extraocular movements intact, No Nystagmus. Eyeball - Right-Extraocular movements intact, No Nystagmus. Cornea - Left-No Hazy. Cornea - Right-No Hazy. Sclera/Conjunctiva - Left-No scleral icterus, No Discharge. Sclera/Conjunctiva - Right-No scleral icterus, No Discharge. Pupil - Left-Direct reaction to light normal. Pupil - Right-Direct reaction to light normal.  ENMT Ears Pinna - Left - no drainage observed, no generalized tenderness observed. Right - no drainage observed, no generalized tenderness observed. Nose and Sinuses External Inspection of the Nose - no destructive lesion observed. Inspection of the nares - Left - quiet respiration. Right - quiet respiration. Mouth and Throat Lips - Upper Lip - no fissures observed, no pallor noted. Lower Lip - no fissures observed, no pallor noted. Nasopharynx - no discharge present. Oral Cavity/Oropharynx - Tongue - no dryness observed. Oral Mucosa - no cyanosis observed. Hypopharynx - no evidence of airway distress observed.  Chest and Lung Exam Inspection Movements - Normal and Symmetrical. Accessory muscles - No use of accessory muscles in breathing. Palpation Palpation of the chest reveals - Non-tender. Auscultation Breath sounds - Normal  and Clear.  Cardiovascular Auscultation Rhythm - Regular. Murmurs & Other Heart Sounds - Auscultation of the heart reveals - No Murmurs and No Systolic Clicks.  Abdomen Inspection Inspection of the abdomen reveals - No Visible peristalsis and No Abnormal pulsations. Umbilicus - No Bleeding, No Urine drainage. Palpation/Percussion Palpation and Percussion of the abdomen reveal - Soft, Non Tender, No Rebound tenderness, No Rigidity (guarding) and No Cutaneous hyperesthesia. Note: Morbidly obese but soft. Moderate suprapubic umbilical diastases recti. Midline incision. Sensitivity at bellybutton with a few small Swiss cheese type hernias felt. Point of tenderness. Reducible.  Female Genitourinary Sexual Maturity Tanner 5 - Adult hair pattern. Note: No vaginal bleeding nor discharge. Good hygiene. Mild panniculus. No inguinal hernias.  Peripheral Vascular Upper Extremity Inspection - Left - No Cyanotic nailbeds, Not Ischemic. Right - No Cyanotic nailbeds, Not Ischemic.  Neurologic Neurologic evaluation reveals -normal attention span and ability to concentrate, able to name objects and repeat phrases. Appropriate fund of knowledge , normal sensation and normal coordination. Mental Status Affect - not angry, not paranoid. Cranial Nerves-Normal Bilaterally. Gait-Normal.  Neuropsychiatric Mental status exam performed with findings of-able to articulate well with normal speech/language, rate, volume and coherence, thought content normal with ability to perform basic computations and apply abstract reasoning and no evidence of hallucinations, delusions, obsessions or homicidal/suicidal ideation.  Musculoskeletal Global Assessment Spine, Ribs and Pelvis - no instability, subluxation or laxity. Right Upper Extremity - no instability, subluxation or laxity.  Lymphatic Head & Neck  General Head & Neck Lymphatics: Bilateral - Description - No Localized  lymphadenopathy. Axillary  General Axillary Region: Bilateral - Description - No Localized lymphadenopathy. Femoral & Inguinal  Generalized Femoral & Inguinal Lymphatics: Left - Description - No Localized lymphadenopathy. Right - Description - No Localized lymphadenopathy.    Assessment &  Plan Heidi [name](Jearldean Gutt C. 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 I think she would benefit from diagnostic laparoscopy with probable larger underlay hernia repair with mesh given her diastases and obesity. She wondered if she had should have an open approach instead. I am a little where it going back and through an open incision. Would start with a camera first is that we'll not burning any bridges. I think she will just need a giant sheet of mesh this time around.  She was tearful and anxious about another operation but is more skirt about another episode of incarceration requiring emergency surgery as happened last year and [place].  I do feel like her chronic constipation and her obesity her significant risk factors. She is trying to lose weight. Her constipation is under much better control with help of the laxatives. Sounds like she is using MiraLAX and Colace. That seems to correlate with what Dr. [name] had given her last month.  ENCOUNTER FOR PRE-OPERATIVE EXAMINATION - [facility] (Z61.096(Z01.818)  Current Plans You are being scheduled for surgery - Our schedulers will call you.  You should hear from our office's scheduling department within 5 working days about the location, date, and time of surgery. We try to make accommodations for patient's preferences in scheduling surgery, but sometimes the OR schedule or the surgeon's schedule prevents us from making those accommodations.  If you have not heard from our office [phone]) in 5 working days, call the office and ask for your surgeon's  nurse.  If you have other questions about your diagnosis, plan, or surgery, call the office and ask for your surgeon's nurse.  Written instructions provided The anatomy & physiology of the abdominal wall was discussed. The pathophysiology of hernias was discussed. Natural history risks without surgery including progeressive enlargement, pain, incarceration, & strangulation was discussed. Contributors to complications such as smoking, obesity, diabetes, prior surgery, etc were discussed.  I feel the risks of no intervention will lead to serious problems that outweigh the operative risks; therefore, I recommended surgery to reduce and repair the hernia. I explained laparoscopic techniques with possible need for an open approach. I noted the probable use of mesh to patch and/or buttress the hernia repair  Risks such as bleeding, infection, abscess, need for further treatment, heart attack, death, and other risks were discussed. I noted a good likelihood this will help address the problem. Goals of post-operative recovery were discussed as well. Possibility that this will not correct all symptoms was explained. I stressed the importance of low-impact activity, aggressive pain control, avoiding constipation, & not pushing through pain to minimize risk of post-operative chronic pain or injury. Possibility of reherniation especially with smoking, obesity, diabetes, immunosuppression, and other health conditions was discussed. We will work to minimize complications.  An educational handout further explaining the pathology & treatment options was given as well. Questions were answered. The patient expresses understanding & wishes to proceed with surgery.  Pt Education - Pamphlet Given - Laparoscopic Hernia Repair: discussed with patient and provided information. Pt Education - [facility] Hernia Post-Op HCI (Mikala Podoll): discussed with patient and provided information. 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 Therefore would plan larger sheet of mesh to help minimize the risk of recurrence  Current Plans Pt Education - [facility] Diastasis Recti:  discussed with patient and provided information. CHRONIC CONSTIPATION (K59.09) Impression: Definitely a risk factor for hernia recurrence. Seems to be under better control. Continue bowel regimen. Goal is one soft bowel movement a day.  Current Plans Pt Education - [facility] Constipation (AT) Pt Education - [facility] Good Bowel Health (Delva Derden)

## 2015-10-18 NOTE — Discharge Instructions (Signed)
HERNIA REPAIR: POST OP INSTRUCTIONS ° °1. DIET: Follow a light bland diet the first 24 hours after arrival home, such as soup, liquids, crackers, etc.  Be sure to include lots of fluids daily.  Avoid fast food or heavy meals as your are more likely to get nauseated.  Eat a low fat the next few days after surgery. °2. Take your usually prescribed home medications unless otherwise directed. °3. PAIN CONTROL: °a. Pain is best controlled by a usual combination of three different methods TOGETHER: °i. Ice/Heat °ii. Over the counter pain medication °iii. Prescription pain medication °b. Most patients will experience some swelling and bruising around the hernia(s) such as the bellybutton, groins, or old incisions.  Ice packs or heating pads (30-60 minutes up to 6 times a day) will help. Use ice for the first few days to help decrease swelling and bruising, then switch to heat to help relax tight/sore spots and speed recovery.  Some people prefer to use ice alone, heat alone, alternating between ice & heat.  Experiment to what works for you.  Swelling and bruising can take several weeks to resolve.   °c. It is helpful to take an over-the-counter pain medication regularly for the first few weeks.  Choose one of the following that works best for you: °i. Naproxen (Aleve, etc)  Two 220mg tabs twice a day °ii. Ibuprofen (Advil, etc) Three 200mg tabs four times a day (every meal & bedtime) °iii. Acetaminophen (Tylenol, etc) 325-650mg four times a day (every meal & bedtime) °d. A  prescription for pain medication should be given to you upon discharge.  Take your pain medication as prescribed.  °i. If you are having problems/concerns with the prescription medicine (does not control pain, nausea, vomiting, rash, itching, etc), please call us [phone] to see if we need to switch you to a different pain medicine that will work better for you and/or control your side effect better. °ii. If you need a refill on your pain  medication, please contact your pharmacy.  They will contact our office to request authorization. Prescriptions will not be filled after 5 pm or on week-ends. °4. Avoid getting constipated.  Between the surgery and the pain medications, it is common to experience some constipation.  Increasing fluid intake and taking a fiber supplement (such as Metamucil, Citrucel, FiberCon, MiraLax, etc) 1-2 times a day regularly will usually help prevent this problem from occurring.  A mild laxative (prune juice, Milk of Magnesia, MiraLax, etc) should be taken according to package directions if there are no bowel movements after 48 hours.   °5. Wash / shower every day.  You may shower over the dressings as they are waterproof.   °6. Remove your waterproof bandages 5 days after surgery.  You may leave the incision open to air.  You may replace a dressing/Band-Aid to cover the incision for comfort if you wish.  Continue to shower over incision(s) after the dressing is off. ° ° ° °7. ACTIVITIES as tolerated:   °a. You may resume regular (light) daily activities beginning the next day--such as daily self-care, walking, climbing stairs--gradually increasing activities as tolerated.  If you can walk 30 minutes without difficulty, it is safe to try more intense activity such as jogging, treadmill, bicycling, low-impact aerobics, swimming, etc. °b. Save the most intensive and strenuous activity for last such as sit-ups, heavy lifting, contact sports, etc  Refrain from any heavy lifting or straining until you are off narcotics for pain control.   °  c. DO NOT PUSH THROUGH PAIN.  Let pain be your guide: If it hurts to do something, don't do it.  Pain is your body warning you to avoid that activity for another week until the pain goes down. °d. You may drive when you are no longer taking prescription pain medication, you can comfortably wear a seatbelt, and you can safely maneuver your car and apply brakes. °e. You may have sexual intercourse  when it is comfortable.  °8. FOLLOW UP in our office °a. Please call [facility] at [phone] to set up an appointment to see your surgeon in the office for a follow-up appointment approximately 2-3 weeks after your surgery. °b. Make sure that you call for this appointment the day you arrive home to insure a convenient appointment time. °9.  IF YOU HAVE DISABILITY OR FAMILY LEAVE FORMS, BRING THEM TO THE OFFICE FOR PROCESSING.  DO NOT GIVE THEM TO YOUR DOCTOR. ° °WHEN TO CALL US [phone]: °1. Poor pain control °2. Reactions / problems with new medications (rash/itching, nausea, etc)  °3. Fever over 101.5 F (38.5 C) °4. Inability to urinate °5. Nausea and/or vomiting °6. Worsening swelling or bruising °7. Continued bleeding from incision. °8. Increased pain, redness, or drainage from the incision ° ° The clinic staff is available to answer your questions during regular business hours (8:30am-5pm).  Please don’t hesitate to call and ask to speak to one of our nurses for clinical concerns.  ° If you have a medical emergency, go to the nearest emergency room or call 911. ° A surgeon from [facility] is always on call at the hospitals in [place] ° °[facility] °[address], [address], [place], [place]  [address] ? ° [address] [address], [place], [place]   [address] °MAIN: [phone] ? TOLL FREE: [phone] ? FAX: [phone] °[email or web address] ° °Managing Pain ° °Pain after surgery or related to activity is often due to strain/injury to muscle, tendon, nerves and/or incisions.  This pain is usually short-term and will improve in a few months.  ° °Many people find it helpful to do the following things TOGETHER to help speed the process of healing and to get back to regular activity more quickly: ° °1. Avoid heavy physical activity at first °a. No lifting greater than 20 pounds at first, then increase to lifting as tolerated over the next few weeks °b. Do not “push  through” the pain.  Listen to your body and avoid positions and maneuvers than reproduce the pain.  Wait a few days before trying something more intense °c. Walking is okay as tolerated, but go slowly and stop when getting sore.  If you can walk 30 minutes without stopping or pain, you can try more intense activity (running, jogging, aerobics, cycling, swimming, treadmill, sex, sports, weightlifting, etc ) °d. Remember: If it hurts to do it, then don’t do it! ° °2. Take Anti-inflammatory medication °a. Choose ONE of the following over-the-counter medications: °i.            Acetaminophen 500mg tabs (Tylenol) 1-2 pills with every meal and just before bedtime (avoid if you have liver problems) °ii.            Naproxen 220mg tabs (ex. Aleve) 1-2 pills twice a day (avoid if you have kidney, stomach, IBD, or bleeding problems) °iii. Ibuprofen 200mg tabs (ex. Advil, Motrin) 3-4 pills with every meal and just before bedtime (avoid if you have kidney, stomach, IBD, or bleeding   problems) °b. Take with food/snack around the clock for 1-2 weeks °i. This helps the muscle and nerve tissues become less irritable and calm down faster ° °3. Use a Heating pad or Ice/Cold Pack °a. 4-6 times a day °b. May use warm bath/hottub  or showers ° °4. Try Gentle Massage and/or Stretching  °a. at the area of pain many times a day °b. stop if you feel pain - do not overdo it ° °Try these steps together to help you body heal faster and avoid making things get worse.  Doing just one of these things may not be enough.   ° °If you are not getting better after two weeks or are noticing you are getting worse, contact our office for further advice; we may need to re-evaluate you & see what other things we can do to help. ° °GETTING TO GOOD BOWEL HEALTH. °Irregular bowel habits such as constipation and diarrhea can lead to many problems over time.  Having one soft bowel movement a day is the most important way to prevent further problems.  The  anorectal canal is designed to handle stretching and feces to safely manage our ability to get rid of solid waste (feces, poop, stool) out of our body.  BUT, hard constipated stools can act like ripping concrete bricks and diarrhea can be a burning fire to this very sensitive area of our body, causing inflamed hemorrhoids, anal fissures, increasing risk is perirectal abscesses, abdominal pain/bloating, an making irritable bowel worse.     ° °The goal: ONE SOFT BOWEL MOVEMENT A DAY!  To have soft, regular bowel movements:  °• Drink plenty of fluids, consider 4-6 tall glasses of water a day.   °• Take plenty of fiber.  Fiber is the undigested part of plant food that passes into the colon, acting s “natures broom” to encourage bowel motility and movement.  Fiber can absorb and hold large amounts of water. This results in a larger, bulkier stool, which is soft and easier to pass. Work gradually over several weeks up to 6 servings a day of fiber (25g a day even more if needed) in the form of: °o Vegetables -- Root (potatoes, carrots, turnips), leafy green (lettuce, salad greens, celery, spinach), or cooked high residue (cabbage, broccoli, etc) °o Fruit -- Fresh (unpeeled skin & pulp), Dried (prunes, apricots, cherries, etc ),  or stewed ( applesauce)  °o Whole grain breads, pasta, etc (whole wheat)  °o Bran cereals  °• Bulking Agents -- This type of water-retaining fiber generally is easily obtained each day by one of the following:  °o Psyllium bran -- The psyllium plant is remarkable because its ground seeds can retain so much water. This product is available as Metamucil, Konsyl, Effersyllium, Per Diem Fiber, or the less expensive generic preparation in drug and health food stores. Although labeled a laxative, it really is not a laxative.  °o Methylcellulose -- This is another fiber derived from wood which also retains water. It is available as Citrucel. °o Polyethylene Glycol - and “artificial” fiber commonly called  Miralax or Glycolax.  It is helpful for people with gassy or bloated feelings with regular fiber °o Flax Seed - a less gassy fiber than psyllium °• No reading or other relaxing activity while on the toilet. If bowel movements take longer than 5 minutes, you are too constipated °• AVOID CONSTIPATION.  High fiber and water intake usually takes care of this.  Sometimes a laxative is needed to stimulate more frequent   bowel movements, but  °• Laxatives are not a good long-term solution as it can wear the colon out.  They can help jump-start bowels if constipated, but should be relied on constantly without discussing with your doctor °o Osmotics (Milk of Magnesia, Fleets phosphosoda, Magnesium citrate, MiraLax, GoLytely) are safer than  °o Stimulants (Senokot, Castor Oil, Dulcolax, Ex Lax)    °o Avoid taking laxatives for more than 7 days in a row. °•  IF SEVERELY CONSTIPATED, try a Bowel Retraining Program: °o Do not use laxatives.  °o Eat a diet high in roughage, such as bran cereals and leafy vegetables.  °o Drink six (6) ounces of prune or apricot juice each morning.  °o Eat two (2) large servings of stewed fruit each day.  °o Take one (1) heaping tablespoon of a psyllium-based bulking agent twice a day. Use sugar-free sweetener when possible to avoid excessive calories.  °o Eat a normal breakfast.  °o Set aside 15 minutes after breakfast to sit on the toilet, but do not strain to have a bowel movement.  °o If you do not have a bowel movement by the third day, use an enema and repeat the above steps.  °• Controlling diarrhea °o Switch to liquids and simpler foods for a few days to avoid stressing your intestines further. °o Avoid dairy products (especially milk & ice cream) for a short time.  The intestines often can lose the ability to digest lactose when stressed. °o Avoid foods that cause gassiness or bloating.  Typical foods include beans and other legumes, cabbage, broccoli, and dairy foods.  Every person has  some sensitivity to other foods, so listen to our body and avoid those foods that trigger problems for you. °o Adding fiber (Citrucel, Metamucil, psyllium, Miralax) gradually can help thicken stools by absorbing excess fluid and retrain the intestines to act more normally.  Slowly increase the dose over a few weeks.  Too much fiber too soon can backfire and cause cramping & bloating. °o Probiotics (such as active yogurt, Align, etc) may help repopulate the intestines and colon with normal bacteria and calm down a sensitive digestive tract.  Most studies show it to be of mild help, though, and such products can be costly. °o Medicines: °- Bismuth subsalicylate (ex. Kayopectate, Pepto Bismol) every 30 minutes for up to 6 doses can help control diarrhea.  Avoid if pregnant. °- Loperamide (Immodium) can slow down diarrhea.  Start with two tablets (4mg total) first and then try one tablet every 6 hours.  Avoid if you are having fevers or severe pain.  If you are not better or start feeling worse, stop all medicines and call your doctor for advice °o Call your doctor if you are getting worse or not better.  Sometimes further testing (cultures, endoscopy, X-ray studies, bloodwork, etc) may be needed to help diagnose and treat the cause of the diarrhea. ° °TROUBLESHOOTING IRREGULAR BOWELS °1) Avoid extremes of bowel movements (no bad constipation/diarrhea) °2) Miralax 17gm mixed in 8oz. water or juice-daily. May use BID as needed.  °3) Gas-x,Phazyme, etc. as needed for gas & bloating.  °4) Soft,bland diet. No spicy,greasy,fried foods.  °5) Prilosec over-the-counter as needed  °6) May hold gluten/wheat products from diet to see if symptoms improve.  °7)  May try probiotics (Align, Activa, etc) to help calm the bowels down °7) If symptoms become worse call back immediately. ° °Hernia, Adult °A hernia is the bulging of an organ or tissue through a weak spot   in the muscles of the abdomen (abdominal wall). Hernias develop most  often near the navel or groin. °There are many kinds of hernias. Common kinds include: °· Femoral hernia. This kind of hernia develops under the groin in the upper thigh area. °· Inguinal hernia. This kind of hernia develops in the groin or scrotum. °· Umbilical hernia. This kind of hernia develops near the navel. °· Hiatal hernia. This kind of hernia causes part of the stomach to be pushed up into the chest. °· Incisional hernia. This kind of hernia bulges through a scar from an abdominal surgery. °CAUSES °This condition may be caused by: °· Heavy lifting. °· Coughing over a long period of time. °· Straining to have a bowel movement. °· An incision made during an abdominal surgery. °· A birth defect (congenital defect). °· Excess weight or obesity. °· Smoking. °· Poor nutrition. °· Cystic fibrosis. °· Excess fluid in the abdomen. °· Undescended testicles. °SYMPTOMS °Symptoms of a hernia include: °· A lump on the abdomen. This is the first sign of a hernia. The lump may become more obvious with standing, straining, or coughing. It may get bigger over time if it is not treated or if the condition causing it is not treated. °· Pain. A hernia is usually painless, but it may become painful over time if treatment is delayed. The pain is usually dull and may get worse with standing or lifting heavy objects. °Sometimes a hernia gets tightly squeezed in the weak spot (strangulated) or stuck there (incarcerated) and causes additional symptoms. These symptoms may include: °· Vomiting. °· Nausea. °· Constipation. °· Irritability. °DIAGNOSIS °A hernia may be diagnosed with: °· A physical exam. During the exam your health care provider may ask you to cough or to make a specific movement, because a hernia is usually more visible when you move. °· Imaging tests. These can include: °¨ X-rays. °¨ Ultrasound. °¨ CT scan. °TREATMENT °A hernia that is small and painless may not need to be treated. A hernia that is large or painful may  be treated with surgery. Inguinal hernias may be treated with surgery to prevent incarceration or strangulation. Strangulated hernias are always treated with surgery, because lack of blood to the trapped organ or tissue can cause it to die. °Surgery to treat a hernia involves pushing the bulge back into place and repairing the weak part of the abdomen. °HOME CARE INSTRUCTIONS °· Avoid straining. °· Do not lift anything heavier than 10 lb (4.5 kg). °· Lift with your leg muscles, not your back muscles. This helps avoid strain. °· When coughing, try to cough gently. °· Prevent constipation. Constipation leads to straining with bowel movements, which can make a hernia worse or cause a hernia repair to break down. You can prevent constipation by: °¨ Eating a high-fiber diet that includes plenty of fruits and vegetables. °¨ Drinking enough fluids to keep your urine clear or pale yellow. Aim to drink 6-8 glasses of water per day. °¨ Using a stool softener as directed by your health care provider. °· Lose weight, if you are overweight. °· Do not use any tobacco products, including cigarettes, chewing tobacco, or electronic cigarettes. If you need help quitting, ask your health care provider. °· Keep all follow-up visits as directed by your health care provider. This is important. Your health care provider may need to monitor your condition. °SEEK MEDICAL CARE IF: °· You have swelling, redness, and pain in the affected area. °· Your bowel habits change. °SEEK IMMEDIATE   MEDICAL CARE IF: °· You have a fever. °· You have abdominal pain that is getting worse. °· You feel nauseous or you vomit. °· You cannot push the hernia back in place by gently pressing on it while you are lying down. °· The hernia: °¨ Changes in shape or size. °¨ Is stuck outside the abdomen. °¨ Becomes discolored. °¨ Feels hard or tender. °  °This information is not intended to replace advice given to you by your health care provider. Make sure you discuss  any questions you have with your health care provider. °  °Document Released: 07/28/2005 Document Revised: 08/18/2014 Document Reviewed: 06/07/2014 °[employer] Interactive Patient Education ©2016 [employer] ° °

## 2015-10-19 DIAGNOSIS — F419 Anxiety disorder, unspecified: Secondary | ICD-10-CM

## 2015-10-19 DIAGNOSIS — E669 Obesity, unspecified: Secondary | ICD-10-CM

## 2015-10-19 DIAGNOSIS — R05 Cough: Secondary | ICD-10-CM

## 2015-10-19 DIAGNOSIS — R059 Cough, unspecified: Secondary | ICD-10-CM

## 2015-10-19 MED ORDER — PSYLLIUM 95 % PO PACK
1.0000 | PACK | Freq: Two times a day (BID) | ORAL | Status: DC
  Administered 2015-10-19 – 2015-10-21 (×5): 1 via ORAL
  Filled 2015-10-19 (×6): qty 1

## 2015-10-19 MED ORDER — METHOCARBAMOL 1000 MG/10ML IJ SOLN
1000.0000 mg | Freq: Three times a day (TID) | INTRAVENOUS | Status: DC
  Administered 2015-10-19 – 2015-10-21 (×6): 1000 mg via INTRAVENOUS
  Filled 2015-10-19 (×9): qty 10

## 2015-10-19 MED ORDER — BENZONATATE 100 MG PO CAPS
100.0000 mg | ORAL_CAPSULE | Freq: Three times a day (TID) | ORAL | Status: DC | PRN
  Administered 2015-10-19: 100 mg via ORAL

## 2015-10-19 MED ORDER — LORAZEPAM 2 MG/ML IJ SOLN: 0.5000 mg | Freq: Three times a day (TID) | INTRAMUSCULAR | Status: DC | PRN

## 2015-10-19 MED ORDER — NAPROXEN 500 MG PO TABS
500.0000 mg | ORAL_TABLET | Freq: Three times a day (TID) | ORAL | Status: DC
  Administered 2015-10-19 – 2015-10-21 (×5): 500 mg via ORAL
  Filled 2015-10-19 (×9): qty 1

## 2015-10-19 MED ORDER — OXYCODONE HCL 5 MG PO TABS
5.0000 mg | ORAL_TABLET | ORAL | Status: DC | PRN
  Administered 2015-10-19 (×2): 10 mg via ORAL
  Administered 2015-10-20: 15 mg via ORAL
  Administered 2015-10-21 (×2): 5 mg via ORAL
  Filled 2015-10-19 (×2): qty 2
  Filled 2015-10-19: qty 1
  Filled 2015-10-19: qty 3
  Filled 2015-10-19: qty 1

## 2015-10-19 MED ORDER — LORATADINE 10 MG PO TABS
10.0000 mg | ORAL_TABLET | Freq: Every day | ORAL | Status: DC
  Administered 2015-10-19 – 2015-10-21 (×3): 10 mg via ORAL
  Filled 2015-10-19 (×3): qty 1

## 2015-10-19 NOTE — Discharge Summary (Signed)
Physician Discharge Summary  Patient ID: Heidi [name] MRN: [id number] DOB/AGE: 12-03-1980 35 y.o.  Admit date: 10/18/2015 Discharge date: 10/21/2015   Patient Care Team: Latrelle Dodrill, MD as PCP - General (Family Medicine) Karie Soda, MD as Consulting Physician (General Surgery)  Admission Diagnoses: Principal Problem:   Recurrent ventral incisional hernia s/p lap repair w mesh 10/19/2015 Active Problems:   H/O ventral hernia repair   Anxiousness   Obesity (BMI 30-39.9)   Cough   Discharge Diagnoses:  Principal Problem:   Recurrent ventral incisional hernia s/p lap repair w mesh 10/19/2015 Active Problems:   H/O ventral hernia repair   Anxiousness   Obesity (BMI 30-39.9)   Cough   POST-OPERATIVE DIAGNOSIS:   Recurrent ventral wall abdominal wall hernia(s) in abdomen  SURGERY:  10/18/2015  Procedure(s): LAPAROSCOPIC LYSIS OF ADHESIONS, REPAIR OF RECURRENT  VENTRAL WALL INCISIONAL HERNIA  WITH MESH INSERTION OF MESH  SURGEON:    Surgeon(s): Karie Soda, MD  Consults: None  Hospital Course:   The patient underwent the surgery above.  Postoperatively, the patient gradually mobilized and advanced to a solid diet.  Pain and other symptoms were treated aggressively.    By the time of discharge, the patient was walking well the hallways, eating food, having flatus.  Pain was well-controlled on an oral medications.  Based on meeting discharge criteria and continuing to recover, I felt it was safe for the patient to be discharged from the hospital to further recover with close followup. Postoperative recommendations were discussed in detail.  They are written as well.   Significant Diagnostic Studies:  No results found for this or any previous visit (from the past 72 hour(s)).  No results found.  Discharge Exam: Blood pressure 114/60, pulse 99, temperature 98 F (36.7 C), temperature source Oral, resp. rate 16, height  (1.702 m), weight 93.611 kg (206  lb 6 oz), last menstrual period 10/10/2015, SpO2 97 %.  General: Pt awake/alert/oriented x4 in no major acute distress Eyes: PERRL, normal EOM. Sclera nonicteric Neuro: CN II-XII intact w/o focal sensory/motor deficits. Lymph: No head/neck/groin lymphadenopathy Psych:  No delerium/psychosis/paranoia HENT: Normocephalic, Mucus membranes moist.  No thrush Neck: Supple, No tracheal deviation Chest: No pain.  Good respiratory excursion. CV:  Pulses intact.  Regular rhythm MS: Normal AROM mjr joints.  No obvious deformity Abdomen: Soft, Obese.  Nondistended.  TTP at incisions.  No incarcerated hernias. Ext:  SCDs BLE.  No significant edema.  No cyanosis Skin: No petechiae / purpura  Discharged Condition: good   Past Medical History  Diagnosis Date  . No pertinent past medical history   . Umbilical hernia   . Headache(784.0)   . Dizziness   . History of urinary tract infection   . Yeast infection   . Frequent nosebleeds     when headaches occur as stated per pt   . Constipation   . Shortness of breath     intermittent unsure of cause  . Complication of anesthesia     states had difficult breathing after extubation in past    Past Surgical History  Procedure Laterality Date  . Cesarean section  05/15/2012  . Anal fissure repair  08/16/2009  . Cesarean section  12/03/2010  . Umbilical hernia repair N/A 12/20/2012    Procedure: LAPAROSCOPIC UMBILICAL HERNIA;  Surgeon: Shelly Rubenstein, MD;  Location: [facility];  Service: General;  Laterality: N/A;  . Insertion of mesh N/A 12/20/2012    Procedure: INSERTION OF MESH;  Surgeon: Riley Lam  Warnell Bureau, MD;  Location: [facility];  Service: General;  Laterality: N/A;  . Hernia repair    . Ventral hernia repair N/A 10/18/2015    Procedure: LAPAROSCOPIC LYSIS OF ADHESIONS, REPAIR OF RECURRENT  VENTRAL WALL INCISIONAL HERNIA  WITH MESH;  Surgeon: Karie Soda, MD;  Location: [facility];  Service: General;  Laterality: N/A;  . Insertion of mesh N/A 10/18/2015     Procedure: INSERTION OF MESH;  Surgeon: Karie Soda, MD;  Location: [facility];  Service: General;  Laterality: N/A;    Social History   Social History  . Marital Status: Married    Spouse Name: N/A  . Number of Children: N/A  . Years of Education: N/A   Occupational History  . Not on file.   Social History Main Topics  . Smoking status: Never Smoker   . Smokeless tobacco: Never Used  . Alcohol Use: No  . Drug Use: No  . Sexual Activity: Yes    Birth Control/ Protection: Pill   Other Topics Concern  . Not on file   Social History Narrative   Originally from [place]    History reviewed. No pertinent family history.  Current Facility-Administered Medications  Medication Dose Route Frequency Provider Last Rate Last Dose  . acetaminophen (TYLENOL) suppository 650 mg  650 mg Rectal Q6H PRN Karie Soda, MD      . acetaminophen (TYLENOL) tablet 325-650 mg  325-650 mg Oral Q6H PRN Karie Soda, MD      . albuterol (PROVENTIL) (2.5 MG/3ML) 0.083% nebulizer solution 2.5 mg  2.5 mg Nebulization Q6H PRN Karie Soda, MD      . alum & mag hydroxide-simeth (MAALOX/MYLANTA) 200-200-20 MG/5ML suspension 30 mL  30 mL Oral Q6H PRN Karie Soda, MD      . benzonatate (TESSALON) capsule 100 mg  100 mg Oral TID PRN Karie Soda, MD      . bisacodyl (DULCOLAX) suppository 10 mg  10 mg Rectal Daily Karie Soda, MD   10 mg at 10/18/15 2000  . diphenhydrAMINE (BENADRYL) injection 12.5-25 mg  12.5-25 mg Intravenous Q6H PRN Karie Soda, MD      . docusate sodium (COLACE) capsule 100 mg  100 mg Oral Q12H Karie Soda, MD   100 mg at 10/18/15 2145  . enoxaparin (LOVENOX) injection 40 mg  40 mg Subcutaneous Q24H Karie Soda, MD   40 mg at 10/19/15 0724  . guaiFENesin-dextromethorphan (ROBITUSSIN DM) 100-10 MG/5ML syrup 15 mL  15 mL Oral Q4H PRN Karie Soda, MD      . HYDROmorphone (DILAUDID) injection 0.5-2 mg  0.5-2 mg Intravenous Q1H PRN Karie Soda, MD      . lactated ringers bolus 1,000 mL   1,000 mL Intravenous Q8H PRN Karie Soda, MD      . lip balm (CARMEX) ointment 1 application  1 application Topical BID Karie Soda, MD   1 application at 10/18/15 2200  . loratadine (CLARITIN) tablet 10 mg  10 mg Oral Daily Karie Soda, MD      . LORazepam (ATIVAN) injection 0.5-1 mg  0.5-1 mg Intravenous Q8H PRN Karie Soda, MD      . magic mouthwash  15 mL Oral QID PRN Karie Soda, MD      . magnesium hydroxide (MILK OF MAGNESIA) suspension 30 mL  30 mL Oral Q12H PRN Karie Soda, MD      . menthol-cetylpyridinium (CEPACOL) lozenge 3 mg  1 lozenge Oral PRN Karie Soda, MD      . methocarbamol (ROBAXIN)  1,000 mg in dextrose 5 % 50 mL IVPB  1,000 mg Intravenous 3 times per day Karie Soda, MD      . metoprolol (LOPRESSOR) injection 5 mg  5 mg Intravenous Q6H PRN Karie Soda, MD      . metoprolol tartrate (LOPRESSOR) tablet 12.5 mg  12.5 mg Oral Q12H PRN Karie Soda, MD      . naproxen (NAPROSYN) tablet 500 mg  500 mg Oral TID WC Karie Soda, MD      . ondansetron [facility]) injection 4 mg  4 mg Intravenous Q6H PRN Karie Soda, MD       Or  . ondansetron (ZOFRAN) 8 mg in sodium chloride 0.9 % 50 mL IVPB  8 mg Intravenous Q6H PRN Karie Soda, MD      . ondansetron (ZOFRAN-ODT) disintegrating tablet 4-8 mg  4-8 mg Oral Q6H PRN Karie Soda, MD      . oxyCODONE (Oxy IR/ROXICODONE) immediate release tablet 5-15 mg  5-15 mg Oral Q4H PRN Karie Soda, MD      . phenol (CHLORASEPTIC) mouth spray 2 spray  2 spray Mouth/Throat PRN Karie Soda, MD      . polyethylene glycol (MIRALAX / GLYCOLAX) packet 17 g  17 g Oral TID PRN Karie Soda, MD      . promethazine (PHENERGAN) injection 6.25-12.5 mg  6.25-12.5 mg Intravenous Q4H PRN Karie Soda, MD      . psyllium (HYDROCIL/METAMUCIL) packet 1 packet  1 packet Oral BID Karie Soda, MD      . saccharomyces boulardii (FLORASTOR) capsule 250 mg  250 mg Oral BID Karie Soda, MD   250 mg at 10/18/15 2145  . zolpidem (AMBIEN) tablet 5 mg  5 mg Oral  QHS PRN Karie Soda, MD   5 mg at 10/19/15 0960     Allergies  Allergen Reactions  . Vancomycin Itching and Rash    Disposition: 01-Home or Self Care  Discharge Instructions    Call MD for:  extreme fatigue    Complete by:  As directed      Call MD for:  hives    Complete by:  As directed      Call MD for:  persistant nausea and vomiting    Complete by:  As directed      Call MD for:  redness, tenderness, or signs of infection (pain, swelling, redness, odor or green/yellow discharge around incision site)    Complete by:  As directed      Call MD for:  severe uncontrolled pain    Complete by:  As directed      Call MD for:    Complete by:  As directed   Temperature > 101.59F     Diet - low sodium heart healthy    Complete by:  As directed      Discharge instructions    Complete by:  As directed   Please see discharge instruction sheets.  Also refer to handout given an office.  Please call our office if you have any questions or concerns [phone]     Discharge wound care:    Complete by:  As directed   If you have closed incisions, shower and bathe over these incisions with soap and water every day.  Remove all surgical dressings on postoperative day #3.  You do not need to replace dressings over the closed incisions unless you feel more comfortable with a Band-Aid covering it.   If you have an open wound that requires  packing, please see wound care instructions.  In general, remove all dressings, wash wound with soap and water and then replace with saline moistened gauze.  Do the dressing change at least every day.  Please call our office [phone] if you have further questions.     Driving Restrictions    Complete by:  As directed   No driving until off narcotics and can safely swerve away without pain during an emergency     Increase activity slowly    Complete by:  As directed   Walk an hour a day.  Use 20-30 minute walks.  When you can walk 30 minutes without  difficulty, it is fine to restart low impact/moderate activities such as biking, jogging, swimming, sexual activity, etc.  Eventually you can increase to unrestricted activity when not feeling pain.  If you feel pain: STOP!Marland Kitchen.   Let pain protect you from overdoing it.  Use ice/heat & over-the-counter pain medications to help minimize soreness.  If that is not enough, then use your narcotic pain prescription as needed to remain active.  It is better to take extra pain medications and be more active than to stay bedridden to avoid all pain medications.     Lifting restrictions    Complete by:  As directed   Avoid heavy lifting initially.  Do not push through pain.  You have no specific weight limit - if it hurts to do, DON'T DO IT.   If you feel no pain, you are not injuring anything.  Pain will protect you from injury.  Coughing and sneezing are far more stressful to your incision than any lifting.  Avoid resuming heavy lifting / intense activity until off all narcotic pain medications.  When ready to exercise more, give yourself 2 weeks to gradually get back to full intense exercise/activity.     May shower / Bathe    Complete by:  As directed      May walk up steps    Complete by:  As directed      Sexual Activity Restrictions    Complete by:  As directed   Sexual activity as tolerated.  Do not push through pain.  Pain will protect you from injury.     Walk with assistance    Complete by:  As directed   Walk over an hour a day.  May use a walker/cane/companion to help with balance and stamina.            Medication List    STOP taking these medications        docusate sodium 100 MG capsule  Commonly known as:  COLACE      TAKE these medications        benzonatate 100 MG capsule  Commonly known as:  TESSALON  Take 1 capsule (100 mg total) by mouth every 8 (eight) hours.     ibuprofen 800 MG tablet  Commonly known as:  ADVIL,MOTRIN  Take 1 tablet (800 mg total) by mouth 4 (four) times  daily.     methocarbamol 750 MG tablet  Commonly known as:  ROBAXIN  Take 1 tablet (750 mg total) by mouth 4 (four) times daily as needed (use for muscle cramps/pain).     oxyCODONE 5 MG immediate release tablet  Commonly known as:  Oxy IR/ROXICODONE  Take 1-2 tablets (5-10 mg total) by mouth every 4 (four) hours as needed for moderate pain, severe pain or breakthrough pain.     polyethylene glycol powder powder  Commonly known as:  MIRALAX  Take 17 g by mouth 3 (three) times daily as needed for moderate constipation.           Follow-up Information    Follow up with GROSS,STEVEN C., MD. Schedule an appointment as soon as possible for a visit in 3 weeks.   Specialty:  General Surgery   Why:  To follow up after your operation, To follow up after your hospital stay   Contact information:   [address] [place] [address] [phone]        Signed: Lorenso Courier, M.D., F.A.C.S. Gastrointestinal and Minimally Invasive Surgery [facility] [address] [place], [place] [address] [phone] Main / Paging   10/19/2015, 8:59 AM

## 2015-10-19 NOTE — Progress Notes (Signed)
[facility]  [address]  [place], [place] [address] Phone: [phone] FAX: [phone]   Heidi ShortsHanane [name] [id number] 23-Aug-1980   Assessment  Problem List:   Principal Problem:   Recurrent ventral incisional hernia s/p lap repair w mesh 10/19/2015 Active Problems:   H/O ventral hernia repair   Anxiousness   Obesity (BMI 30-39.9)   Cough   1 Day Post-Op  10/18/2015  Procedure(s): LAPAROSCOPIC LYSIS OF ADHESIONS, REPAIR OF RECURRENT  VENTRAL WALL INCISIONAL HERNIA  WITH MESH INSERTION OF MESH    Fair control of pain  Cough w/o PNA  Plan:  -improve pain control -anxiolysis -cough supp/Tx -VTE prophylaxis- SCDs, etc -mobilize as tolerated to help recovery  Ardeth SportsmanSteven C. Cheyenna Pankowski, M.D., F.A.C.S. Gastrointestinal and Minimally Invasive Surgery [facility] [address] [place], [place] [address] [phone] Main / Paging   10/19/2015  Subjective:  Sore Coughing Husband at bedside RN just outside room  Objective:  Vital signs:  Filed Vitals:   10/18/15 1836 10/18/15 2136 10/19/15 0137 10/19/15 0537  BP: 111/60 112/67 96/52 114/60  Pulse: 101 93 79 99  Temp: 98.9 F (37.2 C) 98 F (36.7 C) 98.6 F (37 C) 98 F (36.7 C)  TempSrc: Oral Axillary Oral Oral  Resp: 16 16 16 16   Height:      Weight:      SpO2: 98% 97% 99% 97%       Intake/Output   Yesterday:  03/09 0701 - 03/10 0700 In: 2712.5 [I.V.:2712.5] Out: 3570 [Urine:3550; Blood:20] This shift:     Bowel function:  Flatus: y  BM: n  Drain: n/a  Physical Exam:  General: Pt awake/alert/oriented x4 in mild acute distress Eyes: PERRL, normal EOM.  Sclera clear.  No icterus Neuro: CN II-XII intact w/o focal sensory/motor deficits. Lymph: No head/neck/groin lymphadenopathy Psych:  No delerium/psychosis/paranoia.  Anxious but consolable HENT: Normocephalic, Mucus membranes moist.  No thrush Neck: Supple,  No tracheal deviation Chest: No chest wall pain w good excursion.  Mildy junky but minimally productive cough.  No wheezing/rales/rhonchi CV:  Pulses intact.  Regular rhythm MS: Normal AROM mjr joints.  No obvious deformity Abdomen: Soft.  Nondistended.  Mildly tender at incisions, esp LLQ.  No evidence of peritonitis.  No incarcerated hernias. Ext:  SCDs BLE.  No mjr edema.  No cyanosis Skin: No petechiae / purpura  Results:   Labs: No results found for this or any previous visit (from the past 48 hour(s)).  Imaging / Studies: No results found.  Medications / Allergies: per chart  Antibiotics: Anti-infectives    Start     Dose/Rate Route Frequency Ordered Stop   10/18/15 1000  ceFAZolin (ANCEF) IVPB 2 g/50 mL premix     2 g 100 mL/hr over 30 Minutes Intravenous On call to O.R. 10/18/15 1000 10/18/15 1044   10/18/15 0600  gentamicin (GARAMYCIN) 360 mg in dextrose 5 % 100 mL IVPB  Status:  Discontinued     360 mg 109 mL/hr over 60 Minutes Intravenous Every 24 hours 10/17/15 1343 10/18/15 1606        Note: Portions of this report may have been transcribed using voice recognition software. Every effort was made to ensure accuracy; however, inadvertent computerized transcription errors may be present.   Any transcriptional errors that result from this process are unintentional.     Ardeth SportsmanSteven C. Hermenia Fritcher, M.D., F.A.C.S. Gastrointestinal and Minimally Invasive Surgery [facility] [address] [place], [place] [address] [phone] Main / Paging   10/19/2015  CARE TEAM:  PCP: Levert Feinstein, MD  Outpatient Care Team: Patient Care Team: Latrelle Dodrill, MD as PCP - General (Family Medicine) Karie Soda, MD as Consulting Physician (General Surgery)  Inpatient Treatment Team: Treatment Team: Attending Provider: Karie Soda, MD; Technician: Luther Bradley, NT; Technician: Bing Neighbors, NT

## 2015-10-20 NOTE — Progress Notes (Signed)
Earlier this afternoon pt's husband came up to unit with several  young children. Pt and husband made aware of current restrictions for children under 12 to be in the hospital per policy due to Flu. Verbalized understanding and husband agreed to take children home.

## 2015-10-20 NOTE — Progress Notes (Signed)
Patient ID: Heidi [name], female   DOB: [birth year], 35 y.o.   MR[facility]: [id number] [facility]:   2 Days Post-Op  Subjective: Mental status is clear.  Husband present.  Main issue is pain.  She is not ready to go home yet.  Objective: Vital signs in last 24 hours: Temp:  [97.8 F (36.6 C)-98.7 F (37.1 C)] 98.1 F (36.7 C) (03/11 0500) Pulse Rate:  [86-97] 87 (03/10 2210) Resp:  [16-18] 18 (03/11 0500) BP: (98-119)/(60-63) 98/61 mmHg (03/10 2210) SpO2:  [95 %-100 %] 95 % (03/10 2210)  Intake/Output from previous day: 03/10 0701 - 03/11 0700 In: 420 [P.O.:240; IV Piggyback:180] Out: 1850 [Urine:1850] Intake/Output this shift:    Physical Exam: Work of breathing is normal.  Using pillow to support abdomen with coughing.    Lab Results:  No results found for this or any previous visit (from the past 48 hour(s)).  Radiology/Results: No results found.  Anti-infectives: Anti-infectives    Start     Dose/Rate Route Frequency Ordered Stop   10/18/15 1000  ceFAZolin (ANCEF) IVPB 2 g/50 mL premix     2 g 100 mL/hr over 30 Minutes Intravenous On call to O.R. 10/18/15 1000 10/18/15 1044   10/18/15 0600  gentamicin (GARAMYCIN) 360 mg in dextrose 5 % 100 mL IVPB  Status:  Discontinued     360 mg 109 mL/hr over 60 Minutes Intravenous Every 24 hours 10/17/15 1343 10/18/15 1606      Assessment/Plan: Problem List: Patient Active Problem List   Diagnosis Date Noted  . Anxiousness 10/19/2015  . Obesity (BMI 30-39.9) 10/19/2015  . Cough 10/19/2015  . H/O ventral hernia repair 10/18/2015  . Chronic constipation 08/31/2015  . Contraception management 10/21/2013  . Recurrent ventral incisional hernia s/p lap repair w mesh 10/19/2015 11/08/2012    Pain control issues post ventral hernia repair-not severe but enough to keep her in the hospital 2 Days Post-Op    LOS: 1 day   Matt B. Daphine DeutscherMartin, MD, [facility]  [facility] [phone]  beeper [phone]  10/20/2015 8:51 AM

## 2015-10-21 NOTE — Care Management Note (Signed)
Case Management Note  Patient Details  Name: Heidi [name] MRN: [id number] Date of Birth: 1980-08-26  Subjective/Objective:      Recurrent ventral incisional hernia s/p lap repair w mesh 10/19/2015              Action/Plan: Discharge Planning: AVS reviewed    NCM spoke to pt and husband at bedside. Pt has [employer] orange card that assist her with physician and meds at discounted price. Pt uses [facility] for her medications. [facility]  PCP-MCINTYRE, Estevan RyderBRITTANY J MD  Expected Discharge Date:  10/21/2015              Expected Discharge Plan:  Home/Self Care  In-House Referral:  NA  Discharge planning Services  CM Consult  Post Acute Care Choice:  NA Choice offered to:  NA  DME Arranged:  N/A DME Agency:  NA  HH Arranged:  NA HH Agency:  NA  Status of Service:  Completed, signed off  Medicare Important Message Given:    Date Medicare IM Given:    Medicare IM give by:    Date Additional Medicare IM Given:    Additional Medicare Important Message give by:     If discussed at Long Length of Stay Meetings, dates discussed:    Additional Comments:  Heidi [name], Heidi Escandon Ellen, RN 10/21/2015, 11:10 AM

## 2015-10-21 NOTE — Discharge Summary (Signed)
Physician Discharge Summary  Patient ID: Heidi ShortsHanane Morison MRN: [id number] DOB/AGE: July 18, 1981 35 y.o.  Admit date: 10/18/2015 Discharge date: 10/21/2015  Admission Diagnoses:  Ventral hernia with obesity  Discharge Diagnoses:  Same post repair  Principal Problem:   Recurrent ventral incisional hernia s/p lap repair w mesh 10/19/2015 Active Problems:   H/O ventral hernia repair   Anxiousness   Obesity (BMI 30-39.9)   Cough   Surgery:  Lap ventral hernia repair with mesh  Discharged Condition: improved  Hospital Course:   Had surgery on Friday.  Was still sore on Saturday with limited mobility.  Got better and was ready for discharge on Sunday.    Consults: none  Significant Diagnostic Studies: none    Discharge Exam: Blood pressure 93/53, pulse 73, temperature 98.3 F (36.8 C), temperature source Oral, resp. rate 16, height 5\' 7"  (1.702 m), weight 93.611 kg (206 lb 6 oz), last menstrual period 10/10/2015, SpO2 99 %. Incisions less painful today  Disposition: 01-Home or Self Care  Discharge Instructions    Call MD for:  extreme fatigue    Complete by:  As directed      Call MD for:  hives    Complete by:  As directed      Call MD for:  persistant nausea and vomiting    Complete by:  As directed      Call MD for:  redness, tenderness, or signs of infection (pain, swelling, redness, odor or green/yellow discharge around incision site)    Complete by:  As directed      Call MD for:  severe uncontrolled pain    Complete by:  As directed      Call MD for:    Complete by:  As directed   Temperature > 101.1F     Diet - low sodium heart healthy    Complete by:  As directed      Diet - low sodium heart healthy    Complete by:  As directed      Discharge instructions    Complete by:  As directed   Please see discharge instruction sheets.  Also refer to handout given an office.  Please call our office if you have any questions or concerns [phone]     Discharge wound  care:    Complete by:  As directed   If you have closed incisions, shower and bathe over these incisions with soap and water every day.  Remove all surgical dressings on postoperative day #3.  You do not need to replace dressings over the closed incisions unless you feel more comfortable with a Band-Aid covering it.   If you have an open wound that requires packing, please see wound care instructions.  In general, remove all dressings, wash wound with soap and water and then replace with saline moistened gauze.  Do the dressing change at least every day.  Please call our office [phone] if you have further questions.     Driving Restrictions    Complete by:  As directed   No driving until off narcotics and can safely swerve away without pain during an emergency     Increase activity slowly    Complete by:  As directed   Walk an hour a day.  Use 20-30 minute walks.  When you can walk 30 minutes without difficulty, it is fine to restart low impact/moderate activities such as biking, jogging, swimming, sexual activity, etc.  Eventually you can increase to unrestricted activity when not  feeling pain.  If you feel pain: STOP!Marland Kitchen   Let pain protect you from overdoing it.  Use ice/heat & over-the-counter pain medications to help minimize soreness.  If that is not enough, then use your narcotic pain prescription as needed to remain active.  It is better to take extra pain medications and be more active than to stay bedridden to avoid all pain medications.     Increase activity slowly    Complete by:  As directed      Lifting restrictions    Complete by:  As directed   Avoid heavy lifting initially.  Do not push through pain.  You have no specific weight limit - if it hurts to do, DON'T DO IT.   If you feel no pain, you are not injuring anything.  Pain will protect you from injury.  Coughing and sneezing are far more stressful to your incision than any lifting.  Avoid resuming heavy lifting / intense activity  until off all narcotic pain medications.  When ready to exercise more, give yourself 2 weeks to gradually get back to full intense exercise/activity.     May shower / Bathe    Complete by:  As directed      May walk up steps    Complete by:  As directed      Sexual Activity Restrictions    Complete by:  As directed   Sexual activity as tolerated.  Do not push through pain.  Pain will protect you from injury.     Walk with assistance    Complete by:  As directed   Walk over an hour a day.  May use a walker/cane/companion to help with balance and stamina.            Medication List    STOP taking these medications        docusate sodium 100 MG capsule  Commonly known as:  COLACE      TAKE these medications        benzonatate 100 MG capsule  Commonly known as:  TESSALON  Take 1 capsule (100 mg total) by mouth every 8 (eight) hours.     ibuprofen 800 MG tablet  Commonly known as:  ADVIL,MOTRIN  Take 1 tablet (800 mg total) by mouth 4 (four) times daily.     methocarbamol 750 MG tablet  Commonly known as:  ROBAXIN  Take 1 tablet (750 mg total) by mouth 4 (four) times daily as needed (use for muscle cramps/pain).     oxyCODONE 5 MG immediate release tablet  Commonly known as:  Oxy IR/ROXICODONE  Take 1-2 tablets (5-10 mg total) by mouth every 4 (four) hours as needed for moderate pain, severe pain or breakthrough pain.     polyethylene glycol powder powder  Commonly known as:  MIRALAX  Take 17 g by mouth 3 (three) times daily as needed for moderate constipation.           Follow-up Information    Follow up with GROSS,STEVEN C., MD. Schedule an appointment as soon as possible for a visit in 3 weeks.   Specialty:  General Surgery   Why:  To follow up after your operation, To follow up after your hospital stay   Contact information:   [address] [place] [address] [phone]       Signed: Valarie Merino 10/21/2015, 9:22 AM

## 2015-10-21 NOTE — Progress Notes (Signed)
Discharge teaching reviewed with patient and spouse. All questions answered. No further questions/ concerns at this time. Patient and spouse verbalized understanding of discharge instructions. IV dc's. Patient's incisions are clean, dry, and intact.

## 2015-10-24 ENCOUNTER — Emergency Department (HOSPITAL_COMMUNITY)
Admission: EM | Admit: 2015-10-24 | Discharge: 2015-10-24 | Disposition: A | Discharge status: Home / Self Care | Payer: Self-pay | Attending: Physician Assistant | Admitting: Physician Assistant

## 2015-10-24 ENCOUNTER — Encounter (HOSPITAL_COMMUNITY): Payer: Self-pay

## 2015-10-24 ENCOUNTER — Emergency Department (HOSPITAL_COMMUNITY): Payer: Self-pay

## 2015-10-24 DIAGNOSIS — R0981 Nasal congestion: Secondary | ICD-10-CM | POA: Insufficient documentation

## 2015-10-24 DIAGNOSIS — R509 Fever, unspecified: Secondary | ICD-10-CM | POA: Insufficient documentation

## 2015-10-24 DIAGNOSIS — R05 Cough: Secondary | ICD-10-CM | POA: Insufficient documentation

## 2015-10-24 DIAGNOSIS — Z791 Long term (current) use of non-steroidal anti-inflammatories (NSAID): Secondary | ICD-10-CM | POA: Insufficient documentation

## 2015-10-24 DIAGNOSIS — Z8619 Personal history of other infectious and parasitic diseases: Secondary | ICD-10-CM | POA: Insufficient documentation

## 2015-10-24 DIAGNOSIS — Z9889 Other specified postprocedural states: Secondary | ICD-10-CM | POA: Insufficient documentation

## 2015-10-24 DIAGNOSIS — Z87440 Personal history of urinary (tract) infections: Secondary | ICD-10-CM | POA: Insufficient documentation

## 2015-10-24 DIAGNOSIS — Z8719 Personal history of other diseases of the digestive system: Secondary | ICD-10-CM | POA: Insufficient documentation

## 2015-10-24 DIAGNOSIS — R059 Cough, unspecified: Secondary | ICD-10-CM

## 2015-10-24 DIAGNOSIS — R109 Unspecified abdominal pain: Secondary | ICD-10-CM | POA: Insufficient documentation

## 2015-10-24 LAB — RAPID STREP SCREEN (MED CTR MEBANE ONLY): STREPTOCOCCUS, GROUP A SCREEN (DIRECT): NEGATIVE

## 2015-10-24 MED ORDER — BENZONATATE 100 MG PO CAPS: 100.0000 mg | ORAL_CAPSULE | Freq: Three times a day (TID) | ORAL | Status: DC | PRN

## 2015-10-24 MED ORDER — GUAIFENESIN 100 MG/5ML PO LIQD
100.0000 mg | ORAL | Status: DC | PRN
Start: 2015-10-24 — End: 2016-06-03

## 2015-10-24 MED ORDER — ACETAMINOPHEN 325 MG PO TABS
650.0000 mg | ORAL_TABLET | Freq: Once | ORAL | Status: AC
  Administered 2015-10-24: 650 mg via ORAL
  Filled 2015-10-24: qty 2

## 2015-10-24 MED ORDER — GUAIFENESIN 100 MG/5ML PO SOLN
5.0000 mL | Freq: Once | ORAL | Status: AC
  Administered 2015-10-24: 100 mg via ORAL
  Filled 2015-10-24: qty 5

## 2015-10-24 MED ORDER — GUAIFENESIN-CODEINE 100-10 MG/5ML PO SOLN: 5.0000 mL | Freq: Four times a day (QID) | ORAL | Status: DC | PRN

## 2015-10-24 NOTE — ED Notes (Signed)
Pt states child at home is sick with similar symptoms

## 2015-10-24 NOTE — Discharge Instructions (Signed)
Please retuern with any concerns.  Take teh cough syrup with codeine only when at home, not driving, it will make you sleepy!   Cough, Adult A cough helps to clear your throat and lungs. A cough may last only 2-3 weeks (acute), or it may last longer than 8 weeks (chronic). Many different things can cause a cough. A cough may be a sign of an illness or another medical condition. HOME CARE  Pay attention to any changes in your cough.  Take medicines only as told by your doctor.  If you were prescribed an antibiotic medicine, take it as told by your doctor. Do not stop taking it even if you start to feel better.  Talk with your doctor before you try using a cough medicine.  Drink enough fluid to keep your pee (urine) clear or pale yellow.  If the air is dry, use a cold steam vaporizer or humidifier in your home.  Stay away from things that make you cough at work or at home.  If your cough is worse at night, try using extra pillows to raise your head up higher while you sleep.  Do not smoke, and try not to be around smoke. If you need help quitting, ask your doctor.  Do not have caffeine.  Do not drink alcohol.  Rest as needed. GET HELP IF:  You have new problems (symptoms).  You cough up yellow fluid (pus).  Your cough does not get better after 2-3 weeks, or your cough gets worse.  Medicine does not help your cough and you are not sleeping well.  You have pain that gets worse or pain that is not helped with medicine.  You have a fever.  You are losing weight and you do not know why.  You have night sweats. GET HELP RIGHT AWAY IF:  You cough up blood.  You have trouble breathing.  Your heartbeat is very fast.   This information is not intended to replace advice given to you by your health care provider. Make sure you discuss any questions you have with your health care provider.   Document Released: 04/10/2011 Document Revised: 04/18/2015 Document Reviewed:  10/04/2014 Elsevier Interactive Patient Education [employer]2016 Elsevier Inc.

## 2015-10-24 NOTE — ED Provider Notes (Signed)
CSN: [id number]     Arrival date & time 10/24/15  1027 History   First MD Initiated Contact with Patient 10/24/15 1044     Chief Complaint  Patient presents with  . Abdominal Pain  . Fever  . Cough  . Shortness of Breath  . Sore Throat     (Consider location/radiation/quality/duration/timing/severity/associated sxs/prior Treatment) HPI   Patient is a very pleasant 35 year old female presenting with cough. Patient had recent hernial surgery. tHis is third time her hernias been repaired. She had this 4 days ago here at [facility]. She reports she's had cough and mild fever congestion. Her child has been sick with the same thing at home. Patient reports abdominal pain when coughing and is here to get medicine to help decrease her cough so as not to disrupt her recent hernia repair.  No nausea no vomiting or diarrhea. Taking adequate by mouth  Past Medical History  Diagnosis Date  . No pertinent past medical history   . Umbilical hernia   . Headache(784.0)   . Dizziness   . History of urinary tract infection   . Yeast infection   . Frequent nosebleeds     when headaches occur as stated per pt   . Constipation   . Shortness of breath     intermittent unsure of cause  . Complication of anesthesia     states had difficult breathing after extubation in past   Past Surgical History  Procedure Laterality Date  . Cesarean section  05/15/2012  . Anal fissure repair  08/16/2009  . Cesarean section  12/03/2010  . Umbilical hernia repair N/A 12/20/2012    Procedure: LAPAROSCOPIC UMBILICAL HERNIA;  Surgeon: Shelly Rubenstein, MD;  Location: [facility];  Service: General;  Laterality: N/A;  . Insertion of mesh N/A 12/20/2012    Procedure: INSERTION OF MESH;  Surgeon: Shelly Rubenstein, MD;  Location: [facility];  Service: General;  Laterality: N/A;  . Hernia repair    . Ventral hernia repair N/A 10/18/2015    Procedure: LAPAROSCOPIC LYSIS OF ADHESIONS, REPAIR OF RECURRENT  VENTRAL WALL INCISIONAL  HERNIA  WITH MESH;  Surgeon: Karie Soda, MD;  Location: [facility];  Service: General;  Laterality: N/A;  . Insertion of mesh N/A 10/18/2015    Procedure: INSERTION OF MESH;  Surgeon: Karie Soda, MD;  Location: [facility];  Service: General;  Laterality: N/A;  . Appendectomy     No family history on file. Social History  Substance Use Topics  . Smoking status: Never Smoker   . Smokeless tobacco: Never Used  . Alcohol Use: No   OB History    Gravida Para Term Preterm AB TAB SAB Ectopic Multiple Living   Review of Systems  Constitutional: Negative for activity change and fatigue.  HENT: Positive for congestion.   Eyes: Negative for discharge.  Respiratory: Positive for cough.   Gastrointestinal: Positive for abdominal pain. Negative for abdominal distention.  Genitourinary: Negative for dysuria.  Skin: Negative for rash.  Allergic/Immunologic: Negative for immunocompromised state.  Neurological: Negative for seizures and speech difficulty.      Allergies  Vancomycin  Home Medications   Prior to Admission medications   Medication Sig Start Date End Date Taking? Authorizing Provider  guaiFENesin-dextromethorphan (ROBITUSSIN DM) 100-10 MG/5ML syrup Take 10 mLs by mouth every 4 (four) hours as needed for cough.   Yes Historical Provider, MD  ibuprofen (ADVIL,MOTRIN) 800 MG  tablet Take 1 tablet (800 mg total) by mouth 4 (four) times daily. 10/18/15  Yes Karie SodaSteven Gross, MD  methocarbamol (ROBAXIN) 750 MG tablet Take 1 tablet (750 mg total) by mouth 4 (four) times daily as needed (use for muscle cramps/pain). 10/18/15  Yes Karie SodaSteven Gross, MD  oxyCODONE (OXY IR/ROXICODONE) 5 MG immediate release tablet Take 1-2 tablets (5-10 mg total) by mouth every 4 (four) hours as needed for moderate pain, severe pain or breakthrough pain. 10/18/15  Yes Karie SodaSteven Gross, MD  benzonatate (TESSALON PERLES) 100 MG capsule Take 1 capsule (100 mg total) by mouth 3 (three) times daily as needed for cough.  10/24/15   Ventura Hollenbeck Lyn Kirby Cortese, MD  guaiFENesin (ROBITUSSIN) 100 MG/5ML liquid Take 5-10 mLs (100-200 mg total) by mouth every 4 (four) hours as needed for cough. 10/24/15   Garon Melander Lyn Velvia Mehrer, MD  guaiFENesin-codeine 100-10 MG/5ML syrup Take 5 mLs by mouth every 6 (six) hours as needed for cough. 10/24/15   Dynasti Kerman Lyn Danni Leabo, MD  polyethylene glycol powder (MIRALAX) powder Take 17 g by mouth 3 (three) times daily as needed for moderate constipation. Patient not taking: Reported on 10/05/2015 08/28/15   Latrelle DodrillBrittany J McIntyre, MD   BP 95/61 mmHg  Pulse 80  Temp(Src) 98.3 F (36.8 C) (Oral)  Resp 18  Ht 5\' 6"  (1.676 m)  Wt 197 lb (89.359 kg)  BMI 31.81 kg/m2  SpO2 96%  LMP 10/10/2015 (Exact Date) Physical Exam  Constitutional: She is oriented to person, place, and time. She appears well-developed and well-nourished.  HENT:  Head: Normocephalic and atraumatic.  Mild erythema posterior pharynx.  Eyes: Conjunctivae are normal. Right eye exhibits no discharge.  Neck: Neck supple.  Cardiovascular: Normal rate, regular rhythm and normal heart sounds.   No murmur heard. Pulmonary/Chest: Effort normal and breath sounds normal. She has no wheezes. She has no rales.  Abdominal: Soft. She exhibits no distension. There is no tenderness.  Surgical scars present. No signs of infection. No erythema no warmth.  Musculoskeletal: Normal range of motion. She exhibits no edema.  Neurological: She is oriented to person, place, and time. No cranial nerve deficit.  Skin: Skin is warm and dry. No rash noted. She is not diaphoretic.  Psychiatric: She has a normal mood and affect. Her behavior is normal.  Nursing note and vitals reviewed.   ED Course  Procedures (including critical care time) Labs Review Labs Reviewed  RAPID STREP SCREEN (NOT AT [facility])  CULTURE, GROUP A STREP [facility])    Imaging Review Dg Chest 2 View  10/24/2015  CLINICAL DATA:  Three-day history of shortness of breath with cough  and congestion EXAM: CHEST  2 VIEW COMPARISON:  October 08, 2015 FINDINGS: There is a tiny granuloma in the right apex region, stable. Lungs elsewhere are clear. Heart size and pulmonary vascularity are normal. No adenopathy. No bone lesions. IMPRESSION: No edema or consolidation. Electronically Signed   By: Bretta BangWilliam  Woodruff III M.D.   On: 10/24/2015 11:55   I have personally reviewed and evaluated these images and lab results as part of my medical decision-making.   EKG Interpretation None      MDM   Final diagnoses:  Cough    Patient is a pleasant 35 year old female presenting 5 days status post hernia repair. She is reporting here with cough. She reports that she has pain in her incision when she coughs. Patient has exposure to flulike illness in her son. Will get chest x-ray to ensure no pneumonia be treated. Patient  has erythema in her posterior pharynx we'll check rapid strep.  I suspect viral illness. We'll give her lots of supportive care at home to help treat cough so patient is able to tolerate.  Xray and strep negative.  Will have her treat symtpoms, follow up with PCP.    Trea Latner Randall An, MD 10/24/15 1531

## 2015-10-24 NOTE — ED Notes (Signed)
Pt c/o of cold like systems, fever 101 oral yesterday. Stomach pains only with cough. Denies N/V. Pt had abd [facility] last Thursday [facility].

## 2015-10-24 NOTE — ED Notes (Signed)
Patient transported to X-ray 

## 2015-10-27 LAB — CULTURE, GROUP A STREP (THRC)

## 2015-11-01 ENCOUNTER — Encounter (HOSPITAL_COMMUNITY): Payer: Self-pay | Admitting: *Deleted

## 2015-11-01 ENCOUNTER — Inpatient Hospital Stay (HOSPITAL_COMMUNITY)
Admission: AD | Admit: 2015-11-01 | Discharge: 2015-11-01 | Disposition: A | Discharge status: Home / Self Care | Payer: No Typology Code available for payment source | Source: Ambulatory Visit | Attending: Obstetrics and Gynecology | Admitting: Obstetrics and Gynecology

## 2015-11-01 DIAGNOSIS — Z3202 Encounter for pregnancy test, result negative: Secondary | ICD-10-CM | POA: Insufficient documentation

## 2015-11-01 DIAGNOSIS — N390 Urinary tract infection, site not specified: Secondary | ICD-10-CM | POA: Insufficient documentation

## 2015-11-01 DIAGNOSIS — B3731 Acute candidiasis of vulva and vagina: Secondary | ICD-10-CM

## 2015-11-01 DIAGNOSIS — N3000 Acute cystitis without hematuria: Secondary | ICD-10-CM

## 2015-11-01 DIAGNOSIS — B373 Candidiasis of vulva and vagina: Secondary | ICD-10-CM | POA: Insufficient documentation

## 2015-11-01 LAB — URINALYSIS, ROUTINE W REFLEX MICROSCOPIC
Bilirubin Urine: NEGATIVE
Glucose, UA: NEGATIVE mg/dL
Ketones, ur: NEGATIVE mg/dL
Nitrite: NEGATIVE
Protein, ur: NEGATIVE mg/dL
SPECIFIC GRAVITY, URINE: 1.015 (ref 1.005–1.030)
pH: 5.5 (ref 5.0–8.0)

## 2015-11-01 LAB — WET PREP, GENITAL
CLUE CELLS WET PREP: NONE SEEN
Sperm: NONE SEEN
TRICH WET PREP: NONE SEEN
YEAST WET PREP: NONE SEEN

## 2015-11-01 LAB — URINE MICROSCOPIC-ADD ON

## 2015-11-01 LAB — POCT PREGNANCY, URINE: PREG TEST UR: NEGATIVE

## 2015-11-01 MED ORDER — FLUCONAZOLE 150 MG PO TABS: ORAL_TABLET | ORAL | Status: DC

## 2015-11-01 MED ORDER — CIPROFLOXACIN HCL 250 MG PO TABS: 250.0000 mg | ORAL_TABLET | Freq: Two times a day (BID) | ORAL | Status: DC

## 2015-11-01 NOTE — MAU Provider Note (Signed)
History     CSN: [id number]  Arrival date and time: 11/01/15 1136   First Provider Initiated Contact with Patient 11/01/15 1205      Chief Complaint  Patient presents with  . Vaginal Itching  . Urinary Frequency   HPI [name] is a 35 y.o. [id number] who presents to MAU today with complaint of vaginal discharge, itching and urinary discomfort. The patient states 4 days of thick, white-green vaginal discharge with associated itching and irritation. She also states 4 days of urinary discomfort and increased frequency. She denies fever. She continues to have diffuse abdominal pain since recent hernia repair 2 weeks ago. She is taking Percocet and Methocarbamol for pain.   OB History    Gravida Para Term Preterm AB TAB SAB Ectopic Multiple Living   Past Medical History  Diagnosis Date  . No pertinent past medical history   . Umbilical hernia   . Headache(784.0)   . Dizziness   . History of urinary tract infection   . Yeast infection   . Frequent nosebleeds     when headaches occur as stated per pt   . Constipation   . Shortness of breath     intermittent unsure of cause  . Complication of anesthesia     states had difficult breathing after extubation in past    Past Surgical History  Procedure Laterality Date  . Cesarean section  05/15/2012  . Anal fissure repair  08/16/2009  . Cesarean section  12/03/2010  . Umbilical hernia repair N/A 12/20/2012    Procedure: LAPAROSCOPIC UMBILICAL HERNIA;  Surgeon: Shelly Rubenstein, MD;  Location: [facility];  Service: General;  Laterality: N/A;  . Insertion of mesh N/A 12/20/2012    Procedure: INSERTION OF MESH;  Surgeon: Shelly Rubenstein, MD;  Location: [facility];  Service: General;  Laterality: N/A;  . Hernia repair    . Ventral hernia repair N/A 10/18/2015    Procedure: LAPAROSCOPIC LYSIS OF ADHESIONS, REPAIR OF RECURRENT  VENTRAL WALL INCISIONAL HERNIA  WITH MESH;  Surgeon: Karie Soda, MD;  Location: [facility];   Service: General;  Laterality: N/A;  . Insertion of mesh N/A 10/18/2015    Procedure: INSERTION OF MESH;  Surgeon: Karie Soda, MD;  Location: [facility];  Service: General;  Laterality: N/A;  . Appendectomy      History reviewed. No pertinent family history.  Social History  Substance Use Topics  . Smoking status: Never Smoker   . Smokeless tobacco: Never Used  . Alcohol Use: No    Allergies:  Allergies  Allergen Reactions  . Vancomycin Itching and Rash    Prescriptions prior to admission  Medication Sig Dispense Refill Last Dose  . benzonatate (TESSALON PERLES) 100 MG capsule Take 1 capsule (100 mg total) by mouth 3 (three) times daily as needed for cough. 20 capsule 0   . guaiFENesin (ROBITUSSIN) 100 MG/5ML liquid Take 5-10 mLs (100-200 mg total) by mouth every 4 (four) hours as needed for cough. 60 mL 0   . guaiFENesin-codeine 100-10 MG/5ML syrup Take 5 mLs by mouth every 6 (six) hours as needed for cough. 120 mL 0   . guaiFENesin-dextromethorphan (ROBITUSSIN DM) 100-10 MG/5ML syrup Take 10 mLs by mouth every 4 (four) hours as needed for cough.   10/24/2015 at Unknown time  . ibuprofen (ADVIL,MOTRIN) 800 MG tablet Take 1 tablet (800 mg total) by mouth 4 (four) times  daily. 60 tablet 2 Past Week at Unknown time  . methocarbamol (ROBAXIN) 750 MG tablet Take 1 tablet (750 mg total) by mouth 4 (four) times daily as needed (use for muscle cramps/pain). 30 tablet 2 10/23/2015 at Unknown time  . oxyCODONE (OXY IR/ROXICODONE) 5 MG immediate release tablet Take 1-2 tablets (5-10 mg total) by mouth every 4 (four) hours as needed for moderate pain, severe pain or breakthrough pain. 40 tablet 0 10/23/2015 at Unknown time  . polyethylene glycol powder (MIRALAX) powder Take 17 g by mouth 3 (three) times daily as needed for moderate constipation. (Patient not taking: Reported on 10/05/2015) 765 g 1     Review of Systems  Constitutional: Negative for fever and malaise/fatigue.  Gastrointestinal:  Positive for abdominal pain and constipation. Negative for nausea, vomiting and diarrhea.  Genitourinary: Positive for dysuria, urgency and frequency. Negative for flank pain.       Neg - vaginal bleeding + vaginal discharge   Physical Exam   Blood pressure 117/63, pulse 97, temperature 98.3 F (36.8 C), temperature source Oral, resp. rate 18, last menstrual period 10/10/2015.  Physical Exam  Nursing note and vitals reviewed. Constitutional: She is oriented to person, place, and time. She appears well-developed and well-nourished. No distress.  HENT:  Head: Normocephalic and atraumatic.  Cardiovascular: Normal rate.   Respiratory: Effort normal.  GI: Soft. She exhibits distension. She exhibits no mass. There is tenderness. There is no rebound and no guarding.  Multiple well-healed incisions from recent surgery noted. No active drainage or bleeding. Small area of ecchymosis above the umbilicus  Genitourinary: Uterus is not enlarged and not tender. Cervix exhibits no motion tenderness, no discharge and no friability. Right adnexum displays no mass and no tenderness. Left adnexum displays no mass and no tenderness. No bleeding in the vagina. Vaginal discharge (small amount of thick, clumpy yellow-white discharge noted) found.  Neurological: She is alert and oriented to person, place, and time.  Skin: Skin is warm and dry. No erythema.  Psychiatric: She has a normal mood and affect.     Results for orders placed or performed during the hospital encounter of 11/01/15 (from the past 24 hour(s))  Urinalysis, Routine w reflex microscopic (not at [facility])     Status: Abnormal   Collection Time: 11/01/15 11:45 AM  Result Value Ref Range   Color, Urine YELLOW YELLOW   APPearance CLEAR CLEAR   Specific Gravity, Urine 1.015 1.005 - 1.030   pH 5.5 5.0 - 8.0   Glucose, UA NEGATIVE NEGATIVE mg/dL   Hgb urine dipstick TRACE (A) NEGATIVE   Bilirubin Urine NEGATIVE NEGATIVE   Ketones, ur NEGATIVE  NEGATIVE mg/dL   Protein, ur NEGATIVE NEGATIVE mg/dL   Nitrite NEGATIVE NEGATIVE   Leukocytes, UA LARGE (A) NEGATIVE  Urine microscopic-add on     Status: Abnormal   Collection Time: 11/01/15 11:45 AM  Result Value Ref Range   Squamous Epithelial / LPF 6-30 (A) NONE SEEN   WBC, UA 6-30 0 - 5 WBC/hpf   RBC / HPF 0-5 0 - 5 RBC/hpf   Bacteria, UA MANY (A) NONE SEEN  Pregnancy, urine POC     Status: None   Collection Time: 11/01/15 11:55 AM  Result Value Ref Range   Preg Test, Ur NEGATIVE NEGATIVE  Wet prep, genital     Status: Abnormal   Collection Time: 11/01/15 12:30 PM  Result Value Ref Range   Yeast Wet Prep HPF POC NONE SEEN NONE SEEN   Trich, Wet  Prep NONE SEEN NONE SEEN   Clue Cells Wet Prep HPF POC NONE SEEN NONE SEEN   WBC, Wet Prep HPF POC MANY (A) NONE SEEN   Sperm NONE SEEN     MAU Course  Procedures None  MDM UPT - negative UA, Wet prep, GC/Chlamydia today Urine culture pending. Will treat today as patient is symptomatic.   Assessment and Plan  A: UTI Yeast vulvovaginitis, clinical  P: Discharge home Rx for Cipro and Diflucan given to patient Continue previously prescribed pain medications as directed Warning signs for worsening condition discussed Patient advised to follow-up with General Surgery as planned for post operative care or sooner if pain worsens or [facility] as needed or if symptoms worsen Patient may return to MAU as needed or if her condition were to change or worsen   Marny Lowenstein, PA-C  11/01/2015, 12:46 PM

## 2015-11-01 NOTE — MAU Note (Addendum)
Pt had hernia repair 15 days ago.  Pt states having pain with urination and urinary frequency that started 4 days ago and is getting worse. Also states has vaginal itching with foul smelling, green discharge that started 4 days ago also.

## 2015-11-01 NOTE — Discharge Instructions (Signed)
Monilial Vaginitis °Vaginitis in a soreness, swelling and redness (inflammation) of the vagina and vulva. Monilial vaginitis is not a sexually transmitted infection. °CAUSES  °Yeast vaginitis is caused by yeast (candida) that is normally found in your vagina. With a yeast infection, the candida has overgrown in number to a point that upsets the chemical balance. °SYMPTOMS  °· White, thick vaginal discharge. °· Swelling, itching, redness and irritation of the vagina and possibly the lips of the vagina (vulva). °· Burning or painful urination. °· Painful intercourse. °DIAGNOSIS  °Things that may contribute to monilial vaginitis are: °· Postmenopausal and virginal states. °· Pregnancy. °· Infections. °· Being tired, sick or stressed, especially if you had monilial vaginitis in the past. °· Diabetes. Good control will help lower the chance. °· Birth control pills. °· Tight fitting garments. °· Using bubble bath, feminine sprays, douches or deodorant tampons. °· Taking certain medications that kill germs (antibiotics). °· Sporadic recurrence can occur if you become ill. °TREATMENT  °Your caregiver will give you medication. °· There are several kinds of anti monilial vaginal creams and suppositories specific for monilial vaginitis. For recurrent yeast infections, use a suppository or cream in the vagina 2 times a week, or as directed. °· Anti-monilial or steroid cream for the itching or irritation of the vulva may also be used. Get your caregiver's permission. °· Painting the vagina with methylene blue solution may help if the monilial cream does not work. °· Eating yogurt may help prevent monilial vaginitis. °HOME CARE INSTRUCTIONS  °· Finish all medication as prescribed. °· Do not have sex until treatment is completed or after your caregiver tells you it is okay. °· Take warm sitz baths. °· Do not douche. °· Do not use tampons, especially scented ones. °· Wear cotton underwear. °· Avoid tight pants and panty  hose. °· Tell your sexual partner that you have a yeast infection. They should go to their caregiver if they have symptoms such as mild rash or itching. °· Your sexual partner should be treated as well if your infection is difficult to eliminate. °· Practice safer sex. Use condoms. °· Some vaginal medications cause latex condoms to fail. Vaginal medications that harm condoms are: °¨ Cleocin cream. °¨ Butoconazole (Femstat®). °¨ Terconazole (Terazol®) vaginal suppository. °¨ Miconazole (Monistat®) (may be purchased over the counter). °SEEK MEDICAL CARE IF:  °· You have a temperature by mouth above 102° F (38.9° C). °· The infection is getting worse after 2 days of treatment. °· The infection is not getting better after 3 days of treatment. °· You develop blisters in or around your vagina. °· You develop vaginal bleeding, and it is not your menstrual period. °· You have pain when you urinate. °· You develop intestinal problems. °· You have pain with sexual intercourse. °  °This information is not intended to replace advice given to you by your health care provider. Make sure you discuss any questions you have with your health care provider. °  °Document Released: 05/07/2005 Document Revised: 10/20/2011 Document Reviewed: 01/29/2015 °Elsevier Interactive Patient Education ©2016 Elsevier Inc. °Urinary Tract Infection °A urinary tract infection (UTI) can occur any place along the urinary tract. The tract includes the kidneys, ureters, bladder, and urethra. A type of germ called bacteria often causes a UTI. UTIs are often helped with antibiotic medicine.  °HOME CARE  °· If given, take antibiotics as told by your doctor. Finish them even if you start to feel better. °· Drink enough fluids to keep your pee (urine)   clear or pale yellow.  Avoid tea, drinks with caffeine, and bubbly (carbonated) drinks.  Pee often. Avoid holding your pee in for a long time.  Pee before and after having sex (intercourse).  Wipe from  front to back after you poop (bowel movement) if you are a woman. Use each tissue only once. GET HELP RIGHT AWAY IF:   You have back pain.  You have lower belly (abdominal) pain.  You have chills.  You feel sick to your stomach (nauseous).  You throw up (vomit).  Your burning or discomfort with peeing does not go away.  You have a fever.  Your symptoms are not better in 3 days. MAKE SURE YOU:   Understand these instructions.  Will watch your condition.  Will get help right away if you are not doing well or get worse.   This information is not intended to replace advice given to you by your health care provider. Make sure you discuss any questions you have with your health care provider.   Document Released: 01/14/2008 Document Revised: 08/18/2014 Document Reviewed: 02/26/2012 Elsevier Interactive Patient Education [employer]2016 Elsevier Inc.

## 2015-11-02 LAB — GC/CHLAMYDIA PROBE AMP (~~LOC~~) NOT AT ARMC
Chlamydia: NEGATIVE
Neisseria Gonorrhea: NEGATIVE

## 2015-11-02 LAB — URINE CULTURE: Culture: NO GROWTH

## 2016-01-21 ENCOUNTER — Emergency Department (HOSPITAL_COMMUNITY): Payer: Self-pay

## 2016-01-21 ENCOUNTER — Emergency Department (HOSPITAL_COMMUNITY)
Admission: EM | Admit: 2016-01-21 | Discharge: 2016-01-21 | Disposition: A | Discharge status: Home / Self Care | Payer: Self-pay | Attending: Emergency Medicine | Admitting: Emergency Medicine

## 2016-01-21 ENCOUNTER — Encounter (HOSPITAL_COMMUNITY): Payer: Self-pay | Admitting: Emergency Medicine

## 2016-01-21 DIAGNOSIS — R112 Nausea with vomiting, unspecified: Secondary | ICD-10-CM | POA: Insufficient documentation

## 2016-01-21 DIAGNOSIS — R197 Diarrhea, unspecified: Secondary | ICD-10-CM | POA: Insufficient documentation

## 2016-01-21 DIAGNOSIS — Z79899 Other long term (current) drug therapy: Secondary | ICD-10-CM | POA: Insufficient documentation

## 2016-01-21 DIAGNOSIS — R109 Unspecified abdominal pain: Secondary | ICD-10-CM

## 2016-01-21 LAB — URINALYSIS, ROUTINE W REFLEX MICROSCOPIC
BILIRUBIN URINE: NEGATIVE
GLUCOSE, UA: NEGATIVE mg/dL
Ketones, ur: NEGATIVE mg/dL
Leukocytes, UA: NEGATIVE
Nitrite: NEGATIVE
Protein, ur: NEGATIVE mg/dL
Specific Gravity, Urine: 1.018 (ref 1.005–1.030)
pH: 6 (ref 5.0–8.0)

## 2016-01-21 LAB — COMPREHENSIVE METABOLIC PANEL
ALBUMIN: 4.1 g/dL (ref 3.5–5.0)
ALK PHOS: 63 U/L (ref 38–126)
ALT: 16 U/L (ref 14–54)
AST: 17 U/L (ref 15–41)
Anion gap: 8 (ref 5–15)
BUN: 9 mg/dL (ref 6–20)
CALCIUM: 8.7 mg/dL — AB (ref 8.9–10.3)
CHLORIDE: 108 mmol/L (ref 101–111)
CO2: 22 mmol/L (ref 22–32)
CREATININE: 0.56 mg/dL (ref 0.44–1.00)
GFR calc Af Amer: >60 mL/min (ref >60)
GFR calc non Af Amer: >60 mL/min (ref >60)
GLUCOSE: 89 mg/dL (ref 65–99)
Potassium: 3.7 mmol/L (ref 3.5–5.1)
SODIUM: 138 mmol/L (ref 135–145)
Total Bilirubin: 0.4 mg/dL (ref 0.3–1.2)
Total Protein: 7.5 g/dL (ref 6.5–8.1)

## 2016-01-21 LAB — CBC
HCT: 42.4 % (ref 36.0–46.0)
Hemoglobin: 13.8 g/dL (ref 12.0–15.0)
MCH: 28.1 pg (ref 26.0–34.0)
MCHC: 32.5 g/dL (ref 30.0–36.0)
MCV: 86.4 fL (ref 78.0–100.0)
PLATELETS: 233 10*3/uL (ref 150–400)
RBC: 4.91 MIL/uL (ref 3.87–5.11)
RDW: 13.6 % (ref 11.5–15.5)
WBC: 8.9 10*3/uL (ref 4.0–10.5)

## 2016-01-21 LAB — URINE MICROSCOPIC-ADD ON

## 2016-01-21 LAB — I-STAT BETA HCG BLOOD, ED (MC, WL, AP ONLY): I-stat hCG, quantitative: <5 m[IU]/mL (ref <5)

## 2016-01-21 LAB — LIPASE, BLOOD: Lipase: 25 U/L (ref 11–51)

## 2016-01-21 MED ORDER — PROMETHAZINE HCL 25 MG PO TABS: 25.0000 mg | ORAL_TABLET | Freq: Four times a day (QID) | ORAL | Status: DC | PRN

## 2016-01-21 MED ORDER — PROMETHAZINE HCL 25 MG PO TABS
25.0000 mg | ORAL_TABLET | Freq: Once | ORAL | Status: AC
  Administered 2016-01-21: 25 mg via ORAL
  Filled 2016-01-21: qty 1

## 2016-01-21 NOTE — Discharge Instructions (Signed)

## 2016-01-21 NOTE — ED Notes (Signed)
Pt states that she had surgery for an umbilical hernia x 3 months ago and has started having worsening pain with N/V since last night. Alert and oriented.

## 2016-01-21 NOTE — ED Provider Notes (Signed)
CSN: [id number]     Arrival date & time 01/21/16  1537 History   First MD Initiated Contact with Patient 01/21/16 2005     Chief Complaint  Patient presents with  . Abdominal Pain     (Consider location/radiation/quality/duration/timing/severity/associated sxs/prior Treatment) HPI   Patient is a 34 yo F with a PMHx of umbilical hernia presenting to the emergency room complaining of abdominal pain. Patient states she has had 3 surgeries done in the past for umbilical hernia repair, most recent was 3 months ago when a mesh was placed. Reports having peri-umbilical abdominal pain for the past 3 months and states the pain became worse yesterday. She was doing household chores yesterday when she experienced 10/10 intensity RUQ and epigastric abdominal pain. She describes the pain as "burning and stretching" in character. Reports having 10 episodes of watery, non-bloody diarrhea yesterday and 2 episodes of bilious, non-bloody vomit today. States her abdominal pain is 8/10 intensity now and she continues to feel nauseous. States Ibuprofen did not help with her abdominal pain. Denies having any fevers, chills, or recent sick contacts. Reports having 2 prior c-sections. She is currently in her menstrual cycle (day 4). States she gets her period every 26 days and each time it lasts 6-7 days.    Past Medical History  Diagnosis Date  . No pertinent past medical history   . Umbilical hernia   . Headache(784.0)   . Dizziness   . History of urinary tract infection   . Yeast infection   . Frequent nosebleeds     when headaches occur as stated per pt   . Constipation   . Shortness of breath     intermittent unsure of cause  . Complication of anesthesia     states had difficult breathing after extubation in past   Past Surgical History  Procedure Laterality Date  . Cesarean section  05/15/2012  . Anal fissure repair  08/16/2009  . Cesarean section  12/03/2010  . Umbilical hernia repair N/A 12/20/2012    Procedure: LAPAROSCOPIC UMBILICAL HERNIA;  Surgeon: Shelly Rubenstein, MD;  Location: [facility];  Service: General;  Laterality: N/A;  . Insertion of mesh N/A 12/20/2012    Procedure: INSERTION OF MESH;  Surgeon: Shelly Rubenstein, MD;  Location: [facility];  Service: General;  Laterality: N/A;  . Hernia repair    . Ventral hernia repair N/A 10/18/2015    Procedure: LAPAROSCOPIC LYSIS OF ADHESIONS, REPAIR OF RECURRENT  VENTRAL WALL INCISIONAL HERNIA  WITH MESH;  Surgeon: Karie Soda, MD;  Location: [facility];  Service: General;  Laterality: N/A;  . Insertion of mesh N/A 10/18/2015    Procedure: INSERTION OF MESH;  Surgeon: Karie Soda, MD;  Location: [facility];  Service: General;  Laterality: N/A;  . Appendectomy     History reviewed. No pertinent family history. Social History  Substance Use Topics  . Smoking status: Never Smoker   . Smokeless tobacco: Never Used  . Alcohol Use: No   OB History    Gravida Para Term Preterm AB TAB SAB Ectopic Multiple Living   2 2 2       2      Review of Systems  Constitutional: Negative for fever and chills.  HENT: Negative for congestion and sore throat.   Eyes: Negative for pain and visual disturbance.  Respiratory: Negative for cough, shortness of breath and wheezing.   Cardiovascular: Negative for chest pain, palpitations and leg swelling.  Gastrointestinal: Positive for nausea, vomiting, abdominal pain  and diarrhea. Negative for constipation, blood in stool and abdominal distention.  Genitourinary: Negative for dysuria and flank pain.  Musculoskeletal: Negative for myalgias, back pain and arthralgias.  Skin: Negative for pallor and rash.  Neurological: Negative for dizziness, weakness, light-headedness and numbness.      Allergies  Vancomycin  Home Medications   Prior to Admission medications   Medication Sig Start Date End Date Taking? Authorizing Provider  Bisacodyl (LAXATIVE PO) Take 1 tablet by mouth daily as needed (constipation).   Yes  Historical Provider, MD  ibuprofen (ADVIL,MOTRIN) 800 MG tablet Take 1 tablet (800 mg total) by mouth 4 (four) times daily. 10/18/15  Yes Karie SodaSteven Gross, MD  benzonatate (TESSALON PERLES) 100 MG capsule Take 1 capsule (100 mg total) by mouth 3 (three) times daily as needed for cough. Patient not taking: Reported on 01/21/2016 10/24/15   Courteney Lyn Mackuen, MD  ciprofloxacin (CIPRO) 250 MG tablet Take 1 tablet (250 mg total) by mouth every 12 (twelve) hours. Patient not taking: Reported on 01/21/2016 11/01/15   Marny LowensteinJulie N Wenzel, PA-C  fluconazole (DIFLUCAN) 150 MG tablet Take 1 tab (150 mg) today and 1 tab (150 mg) with completion of antibiotics Patient not taking: Reported on 01/21/2016 11/01/15   Marny LowensteinJulie N Wenzel, PA-C  guaiFENesin (ROBITUSSIN) 100 MG/5ML liquid Take 5-10 mLs (100-200 mg total) by mouth every 4 (four) hours as needed for cough. Patient not taking: Reported on 01/21/2016 10/24/15   Courteney Lyn Mackuen, MD  guaiFENesin-codeine 100-10 MG/5ML syrup Take 5 mLs by mouth every 6 (six) hours as needed for cough. Patient not taking: Reported on 01/21/2016 10/24/15   Courteney Lyn Mackuen, MD  guaiFENesin-dextromethorphan (ROBITUSSIN DM) 100-10 MG/5ML syrup Take 10 mLs by mouth every 4 (four) hours as needed for cough.    Historical Provider, MD  methocarbamol (ROBAXIN) 750 MG tablet Take 1 tablet (750 mg total) by mouth 4 (four) times daily as needed (use for muscle cramps/pain). Patient not taking: Reported on 01/21/2016 10/18/15   Karie SodaSteven Gross, MD  oxyCODONE (OXY IR/ROXICODONE) 5 MG immediate release tablet Take 1-2 tablets (5-10 mg total) by mouth every 4 (four) hours as needed for moderate pain, severe pain or breakthrough pain. Patient not taking: Reported on 01/21/2016 10/18/15   Karie SodaSteven Gross, MD  polyethylene glycol powder (MIRALAX) powder Take 17 g by mouth 3 (three) times daily as needed for moderate constipation. Patient not taking: Reported on 10/05/2015 08/28/15   Latrelle DodrillBrittany J McIntyre, MD   BP  124/77 mmHg  Pulse 91  Temp(Src) 99 F (37.2 C) (Oral)  Resp 18  SpO2 100%  LMP 01/18/2016 (Approximate) Physical Exam  Constitutional: She is oriented to person, place, and time. She appears well-developed and well-nourished. No distress.  HENT:  Head: Normocephalic and atraumatic.  Mouth/Throat: Oropharynx is clear and moist.  Eyes: EOM are normal. Pupils are equal, round, and reactive to light.  Neck: Neck supple. No tracheal deviation present.  Cardiovascular: Normal rate, regular rhythm and intact distal pulses.  Exam reveals no gallop and no friction rub.   No murmur heard. Pulmonary/Chest: Effort normal and breath sounds normal. No respiratory distress. She has no wheezes. She has no rales.  Abdominal: Soft. Bowel sounds are normal. She exhibits no distension. There is tenderness. There is no rebound and no guarding.  Abdomen tender to palpation in the RUQ and periumbilical region.   Musculoskeletal: She exhibits no edema.  Neurological: She is alert and oriented to person, place, and time.  Skin: Skin is warm and  dry. No rash noted. She is not diaphoretic. No erythema.    ED Course  Procedures (including critical care time) Labs Review Labs Reviewed  COMPREHENSIVE METABOLIC PANEL - Abnormal; Notable for the following:    Calcium 8.7 (*)    All other components within normal limits  LIPASE, BLOOD  CBC  URINALYSIS, ROUTINE W REFLEX MICROSCOPIC (NOT AT [facility])  I-STAT BETA HCG BLOOD, ED ([facility], [facility], [facility] ONLY)    Imaging Review No results found. I have personally reviewed and evaluated these images and lab results as part of my medical decision-making.   EKG Interpretation None      MDM   Final diagnoses:  None  Abdominal pain Patient is presenting acute on chronic abdominal pain. The pain is located in the RUQ and periumbilical region. It is associated with nausea, vomiting, and diarrhea. White count is normal. Lipase normal. Labs are not showing any electrolte  abnornmalities. Abdominal US of RUQ is not showing any gallstones. UA is not suggestive of an infection. Her acute abdominal pain is likely due to viral gastroenteritis. Vitals are stable and patient is stable for discharge. Her nausea  -Phenergan 25 mg once for nausea -Symptomatic management -Patient has been advised to follow-up with her PCP and surgeon as soon as possible. She has been given a script for Phenergan 25 mg q6 prn nausea. Advised to return to the ED if her abdominal pain or other symptoms worsen.    John Giovanni, MD 01/21/16 1610  Lyndal Pulley, MD 01/22/16 [phone]

## 2016-03-04 ENCOUNTER — Encounter (HOSPITAL_COMMUNITY): Payer: Self-pay | Admitting: Emergency Medicine

## 2016-03-04 ENCOUNTER — Ambulatory Visit (HOSPITAL_COMMUNITY)
Admission: EM | Admit: 2016-03-04 | Discharge: 2016-03-04 | Disposition: A | Discharge status: Home / Self Care | Payer: No Typology Code available for payment source | Attending: Family Medicine | Admitting: Family Medicine

## 2016-03-04 DIAGNOSIS — R1033 Periumbilical pain: Secondary | ICD-10-CM

## 2016-03-04 NOTE — ED Provider Notes (Signed)
CSN: [id number]     Arrival date & time 03/04/16  1949 History   None    Chief Complaint  Patient presents with  . Abdominal Pain   (Consider location/radiation/quality/duration/timing/severity/associated sxs/prior Treatment) Patient presents with abdominal pain since her third hernia repair X. 4 months ago Condition is chronic  in nature. Condition is made better by nothing. Condition is made worse by walking and mvoement Patient denies any treatment from any  prior to there arrival at this facility. Patient states that she was seen in the ED approximately 1 month ago  With no relief. Patient denies any fevers, nausea and vomiting at this time.        Past Medical History:  Diagnosis Date  . Complication of anesthesia    states had difficult breathing after extubation in past  . Constipation   . Dizziness   . Frequent nosebleeds    when headaches occur as stated per pt   . Headache(784.0)   . History of urinary tract infection   . No pertinent past medical history   . Shortness of breath    intermittent unsure of cause  . Umbilical hernia   . Yeast infection    Past Surgical History:  Procedure Laterality Date  . ANAL FISSURE REPAIR  08/16/2009  . APPENDECTOMY    . CESAREAN SECTION  05/15/2012  . CESAREAN SECTION  12/03/2010  . HERNIA REPAIR    . INSERTION OF MESH N/A 12/20/2012   Procedure: INSERTION OF MESH;  Surgeon: Shelly Rubenstein, MD;  Location: [facility];  Service: General;  Laterality: N/A;  . INSERTION OF MESH N/A 10/18/2015   Procedure: INSERTION OF MESH;  Surgeon: Karie Soda, MD;  Location: [facility];  Service: General;  Laterality: N/A;  . UMBILICAL HERNIA REPAIR N/A 12/20/2012   Procedure: LAPAROSCOPIC UMBILICAL HERNIA;  Surgeon: Shelly Rubenstein, MD;  Location: [facility];  Service: General;  Laterality: N/A;  . VENTRAL HERNIA REPAIR N/A 10/18/2015   Procedure: LAPAROSCOPIC LYSIS OF ADHESIONS, REPAIR OF RECURRENT  VENTRAL WALL INCISIONAL HERNIA  WITH MESH;  Surgeon:  Karie Soda, MD;  Location: [facility];  Service: General;  Laterality: N/A;   No family history on file. Social History  Substance Use Topics  . Smoking status: Never Smoker  . Smokeless tobacco: Never Used  . Alcohol use No   OB History    Gravida Para Term Preterm AB Living   SAB TAB Ectopic Multiple Live Births                 Review of Systems  Constitutional: Negative.   Gastrointestinal: Positive for abdominal pain (around umbilicus).  Genitourinary: Negative for difficulty urinating, dysuria and flank pain.    Allergies  Vancomycin  Home Medications   Prior to Admission medications   Medication Sig Start Date End Date Taking? Authorizing Provider  benzonatate (TESSALON PERLES) 100 MG capsule Take 1 capsule (100 mg total) by mouth 3 (three) times daily as needed for cough. Patient not taking: Reported on 01/21/2016 10/24/15   Courteney Lyn Mackuen, MD  Bisacodyl (LAXATIVE PO) Take 1 tablet by mouth daily as needed (constipation).    Historical Provider, MD  ciprofloxacin (CIPRO) 250 MG tablet Take 1 tablet (250 mg total) by mouth every 12 (twelve) hours. Patient not taking: Reported on 01/21/2016 11/01/15   Marny Lowenstein, PA-C  fluconazole (DIFLUCAN) 150 MG tablet Take 1 tab (150 mg) today and 1 tab (  150 mg) with completion of antibiotics Patient not taking: Reported on 01/21/2016 11/01/15   Marny Lowenstein, PA-C  guaiFENesin (ROBITUSSIN) 100 MG/5ML liquid Take 5-10 mLs (100-200 mg total) by mouth every 4 (four) hours as needed for cough. Patient not taking: Reported on 01/21/2016 10/24/15   Courteney Lyn Mackuen, MD  guaiFENesin-codeine 100-10 MG/5ML syrup Take 5 mLs by mouth every 6 (six) hours as needed for cough. Patient not taking: Reported on 01/21/2016 10/24/15   Courteney Lyn Mackuen, MD  guaiFENesin-dextromethorphan (ROBITUSSIN DM) 100-10 MG/5ML syrup Take 10 mLs by mouth every 4 (four) hours as needed for cough.    Historical Provider, MD  ibuprofen  (ADVIL,MOTRIN) 800 MG tablet Take 1 tablet (800 mg total) by mouth 4 (four) times daily. 10/18/15   Karie Soda, MD  methocarbamol (ROBAXIN) 750 MG tablet Take 1 tablet (750 mg total) by mouth 4 (four) times daily as needed (use for muscle cramps/pain). Patient not taking: Reported on 01/21/2016 10/18/15   Karie Soda, MD  oxyCODONE (OXY IR/ROXICODONE) 5 MG immediate release tablet Take 1-2 tablets (5-10 mg total) by mouth every 4 (four) hours as needed for moderate pain, severe pain or breakthrough pain. Patient not taking: Reported on 01/21/2016 10/18/15   Karie Soda, MD  polyethylene glycol powder (MIRALAX) powder Take 17 g by mouth 3 (three) times daily as needed for moderate constipation. Patient not taking: Reported on 10/05/2015 08/28/15   Latrelle Dodrill, MD  promethazine (PHENERGAN) 25 MG tablet Take 1 tablet (25 mg total) by mouth every 6 (six) hours as needed for nausea or vomiting. 01/21/16   John Giovanni, MD   Meds Ordered and Administered this Visit  Medications - No data to display  BP 104/62 (BP Location: Left Arm)   Pulse 75   Temp 98.1 F (36.7 C) (Oral)   Resp 18   Ht 5\' 3"  (1.6 m)   Wt 200 lb (90.7 kg)   LMP 02/14/2016   SpO2 100%   BMI 35.43 kg/m  No data found.   Physical Exam  Constitutional: She appears well-developed and well-nourished.  Abdominal: Soft. She exhibits distension. She exhibits no mass. There is tenderness. There is guarding.    Urgent Care Course   Clinical Course    Procedures (including critical care time)  Labs Review Labs Reviewed - No data to display  Imaging Review No results found.   Visual Acuity Review  Right Eye Distance:   Left Eye Distance:   Bilateral Distance:    Right Eye Near:   Left Eye Near:    Bilateral Near:         MDM  No diagnosis found.     Alene Mires, NP 03/04/16 2151

## 2016-03-04 NOTE — Discharge Instructions (Signed)
Follow up with surgeon

## 2016-03-04 NOTE — ED Triage Notes (Signed)
Pt. Stated, I had a hernia operation 4 months ago and my stomach has hurt for the last 2 months. Pt has not contacted the doctor who did the surgery.

## 2016-03-11 ENCOUNTER — Ambulatory Visit (INDEPENDENT_AMBULATORY_CARE_PROVIDER_SITE_OTHER): Payer: No Typology Code available for payment source | Admitting: Family Medicine

## 2016-03-11 ENCOUNTER — Encounter: Payer: Self-pay | Admitting: Family Medicine

## 2016-03-11 VITALS — BP 108/68 | HR 83 | Temp 98.0°F | Ht 63.0 in | Wt 202.8 lb

## 2016-03-11 DIAGNOSIS — G8929 Other chronic pain: Secondary | ICD-10-CM

## 2016-03-11 DIAGNOSIS — K432 Incisional hernia without obstruction or gangrene: Secondary | ICD-10-CM

## 2016-03-11 DIAGNOSIS — K5900 Constipation, unspecified: Secondary | ICD-10-CM

## 2016-03-11 DIAGNOSIS — K5909 Other constipation: Secondary | ICD-10-CM

## 2016-03-11 DIAGNOSIS — R1013 Epigastric pain: Secondary | ICD-10-CM

## 2016-03-11 MED ORDER — LACTULOSE 10 GM/15ML PO SOLN: 20.0000 g | Freq: Two times a day (BID) | ORAL | 0 refills | Status: DC | PRN

## 2016-03-11 MED ORDER — TRAMADOL HCL 50 MG PO TABS: 50.0000 mg | ORAL_TABLET | Freq: Three times a day (TID) | ORAL | 0 refills | Status: DC | PRN

## 2016-03-11 NOTE — Patient Instructions (Signed)
Take the lactulose once or twice daily as needed for constipation if the stool softener is not working.  Take docusate/Colace once a day as a stool softener. You will probably need to take this for the next 3-4 weeks.  This is over the counter.   I have switched you to tramadol which should cause less constipation but still relieve your pain.  We are getting you set up for a CT scan.  Once thsoe results come back I will call and let you know

## 2016-03-11 NOTE — Assessment & Plan Note (Signed)
Still with fair amount of pain. She has had some possible although questionable obstructive symptoms such as occasional abdominal bloating vomiting are going to get a CT scan. This also check to see about hernia repair.

## 2016-03-11 NOTE — Assessment & Plan Note (Signed)
She states that her bowel regimen of Colace and MiraLAX is not working. I'm switching her to lactulose twice a day. She is to continue/restart taking her Colace. If we can get her constipation under control I think this will somewhat help her abdominal pain.

## 2016-03-11 NOTE — Progress Notes (Signed)
Subjective:    Heidi [name] is a 35 y.o. female who presents to [facility] today for hernia:  1.  Hernia:  The patient has been having issues with ventral hernia for the past several months. Her last hernia repair was March 9. She states she's had 2 further repairs for that. She reports continued pain since her surgery. This was actually gradually been worsening. She states that really helps her pain is oxycodone. However she has not tried anything else like tramadol or hydrocodone for pain relief only ibuprofen which she states is not helping. She does report almost daily constipation with what sounds like runaround encopresis which occurs once a week or so. She also has periods of vomiting. Last time she vomited was about a week ago. She states this would just "come out of the blue "and is not usually preceded by nausea. She has pain whenever she has to change positions. It is worse strictly under the surgical scar where her mesh was. No redness. No fevers or chills.  States she is passing gas daily most days. Is doing so today.   ROS as above per HPI  The following portions of the patient's history were reviewed and updated as appropriate: allergies, current medications, past medical history, family and social history, and problem list. Patient is a nonsmoker.    PMH reviewed.  Past Medical History:  Diagnosis Date  . Complication of anesthesia    states had difficult breathing after extubation in past  . Constipation   . Dizziness   . Frequent nosebleeds    when headaches occur as stated per pt   . Headache(784.0)   . History of urinary tract infection   . No pertinent past medical history   . Shortness of breath    intermittent unsure of cause  . Umbilical hernia   . Yeast infection    Past Surgical History:  Procedure Laterality Date  . ANAL FISSURE REPAIR  08/16/2009  . APPENDECTOMY    . CESAREAN SECTION  05/15/2012  . CESAREAN SECTION  12/03/2010  . HERNIA REPAIR    . INSERTION  OF MESH N/A 12/20/2012   Procedure: INSERTION OF MESH;  Surgeon: Shelly Rubenstein, MD;  Location: [facility];  Service: General;  Laterality: N/A;  . INSERTION OF MESH N/A 10/18/2015   Procedure: INSERTION OF MESH;  Surgeon: Karie Soda, MD;  Location: [facility];  Service: General;  Laterality: N/A;  . UMBILICAL HERNIA REPAIR N/A 12/20/2012   Procedure: LAPAROSCOPIC UMBILICAL HERNIA;  Surgeon: Shelly Rubenstein, MD;  Location: [facility];  Service: General;  Laterality: N/A;  . VENTRAL HERNIA REPAIR N/A 10/18/2015   Procedure: LAPAROSCOPIC LYSIS OF ADHESIONS, REPAIR OF RECURRENT  VENTRAL WALL INCISIONAL HERNIA  WITH MESH;  Surgeon: Karie Soda, MD;  Location: [facility];  Service: General;  Laterality: N/A;    Medications reviewed.    Objective:   Physical Exam BP 108/68   Pulse 83   Temp 98 F (36.7 C) (Oral)   Ht  (1.6 m)   Wt 202 lb 12.8 oz (92 kg)   LMP 02/14/2016   BMI 35.92 kg/m  Gen:  Alert, cooperative patient who appears stated age in no acute distress.  Vital signs reviewed. Abd:  Soft and nondistended. Tender directly over well-healed surgical scar which is about 2 cm above umbilicus. She has some minor tenderness around her laparoscopic surgical sites which are also old healed. Even when attempting to flex her abdominal muscles she does not seem  to have much muscle mass. She has moderate to more severe palpation on deep palpation of periumbilical region.  No results found for this or any previous visit (from the past 72 hour(s)).

## 2016-03-14 ENCOUNTER — Ambulatory Visit (HOSPITAL_COMMUNITY)
Admission: RE | Admit: 2016-03-14 | Discharge: 2016-03-14 | Disposition: A | Discharge status: Home / Self Care | Payer: No Typology Code available for payment source | Source: Ambulatory Visit | Attending: Family Medicine | Admitting: Family Medicine

## 2016-03-14 DIAGNOSIS — G8929 Other chronic pain: Secondary | ICD-10-CM | POA: Insufficient documentation

## 2016-03-14 DIAGNOSIS — R1013 Epigastric pain: Secondary | ICD-10-CM | POA: Insufficient documentation

## 2016-03-17 ENCOUNTER — Encounter: Payer: Self-pay | Admitting: Family Medicine

## 2016-03-25 ENCOUNTER — Encounter: Payer: Self-pay | Admitting: Family Medicine

## 2016-05-30 ENCOUNTER — Emergency Department (HOSPITAL_COMMUNITY): Payer: Self-pay

## 2016-05-30 ENCOUNTER — Encounter (HOSPITAL_COMMUNITY): Payer: Self-pay | Admitting: Nurse Practitioner

## 2016-05-30 ENCOUNTER — Emergency Department (HOSPITAL_COMMUNITY)
Admission: EM | Admit: 2016-05-30 | Discharge: 2016-05-30 | Disposition: A | Discharge status: Home / Self Care | Payer: Self-pay | Attending: Emergency Medicine | Admitting: Emergency Medicine

## 2016-05-30 DIAGNOSIS — R21 Rash and other nonspecific skin eruption: Secondary | ICD-10-CM | POA: Insufficient documentation

## 2016-05-30 DIAGNOSIS — R1033 Periumbilical pain: Secondary | ICD-10-CM | POA: Insufficient documentation

## 2016-05-30 LAB — URINALYSIS, ROUTINE W REFLEX MICROSCOPIC
GLUCOSE, UA: NEGATIVE mg/dL
KETONES UR: NEGATIVE mg/dL
LEUKOCYTES UA: NEGATIVE
NITRITE: NEGATIVE
PROTEIN: NEGATIVE mg/dL
Specific Gravity, Urine: 1.023 (ref 1.005–1.030)
pH: 5 (ref 5.0–8.0)

## 2016-05-30 LAB — URINE MICROSCOPIC-ADD ON

## 2016-05-30 LAB — COMPREHENSIVE METABOLIC PANEL
ALBUMIN: 3.9 g/dL (ref 3.5–5.0)
ALK PHOS: 61 U/L (ref 38–126)
ALT: 15 U/L (ref 14–54)
ANION GAP: 5 (ref 5–15)
AST: 18 U/L (ref 15–41)
BUN: <5 mg/dL — ABNORMAL LOW (ref 6–20)
CO2: 25 mmol/L (ref 22–32)
Calcium: 9.1 mg/dL (ref 8.9–10.3)
Chloride: 110 mmol/L (ref 101–111)
Creatinine, Ser: 0.58 mg/dL (ref 0.44–1.00)
GFR calc Af Amer: >60 mL/min (ref >60)
GFR calc non Af Amer: >60 mL/min (ref >60)
GLUCOSE: 115 mg/dL — AB (ref 65–99)
Potassium: 3.6 mmol/L (ref 3.5–5.1)
SODIUM: 140 mmol/L (ref 135–145)
TOTAL PROTEIN: 7.4 g/dL (ref 6.5–8.1)
Total Bilirubin: 0.5 mg/dL (ref 0.3–1.2)

## 2016-05-30 LAB — CBC
HCT: 42.4 % (ref 36.0–46.0)
Hemoglobin: 13.8 g/dL (ref 12.0–15.0)
MCH: 28 pg (ref 26.0–34.0)
MCHC: 32.5 g/dL (ref 30.0–36.0)
MCV: 86.2 fL (ref 78.0–100.0)
PLATELETS: 234 10*3/uL (ref 150–400)
RBC: 4.92 MIL/uL (ref 3.87–5.11)
RDW: 12.9 % (ref 11.5–15.5)
WBC: 7 10*3/uL (ref 4.0–10.5)

## 2016-05-30 LAB — I-STAT BETA HCG BLOOD, ED (MC, WL, AP ONLY)

## 2016-05-30 MED ORDER — ACETAMINOPHEN 500 MG PO TABS
1000.0000 mg | ORAL_TABLET | Freq: Once | ORAL | Status: AC
  Administered 2016-05-30: 1000 mg via ORAL
  Filled 2016-05-30: qty 2

## 2016-05-30 MED ORDER — PREDNISONE 20 MG PO TABS: 40.0000 mg | ORAL_TABLET | Freq: Every day | ORAL | 0 refills | Status: DC

## 2016-05-30 NOTE — ED Provider Notes (Signed)
I saw and evaluated the patient, reviewed the resident's note and I agree with the findings and plan.   EKG Interpretation None     35 year old female here with recurrent abdominal discomfort. History of ventral hernia. Also now has a itchy rash which started below her left breast which is now to her forearms as well as face. Denies any chemical exposures. The patient to be treated for suspected allergic reaction. Her abdominal exam is nonacute.   Lorre NickAnthony Gared Gillie, MD 05/30/16 [phone]

## 2016-05-30 NOTE — ED Notes (Signed)
Pt requesting something for pain.  

## 2016-05-30 NOTE — ED Provider Notes (Signed)
[facility]-EMERGENCY DEPT Provider Note   CSN: [id number] Arrival date & time: 05/30/16  1103     History   Chief Complaint Chief Complaint  Patient presents with  . Abdominal Pain  . Rash    HPI Heidi [name] is a 35 y.o. female with a history of previous ventral hernia repair complaining of periumbilical abdominal pain. She states that pain started last week. She states that she is not noticed any specific event that caused this pain to initiate. Pain is located along the umbilicus and does not radiate. She describes this pain as achy in nature. Pain is worse with changing positions. Pain is made better with bowel movements. She endorses nausea and vomiting earlier in the week, as well as some occasional constipation and diarrhea. She denies any hematemesis, hematochezia, or melena.  Patient is also complaining of a rash. She states that this first started on Tuesday. It began on her right wrist and has now spread to her forearm, upper arm, and axilla bilaterally. She also has some around her eyes. Rash is pruritic. No significant pain. She denies any new medication use or exposures to pets or insects. No new activities. She denies trauma or injury.  Patient endorses recent fevers, chills, diminished appetite, dysuria, and back pain.  HPI  Past Medical History:  Diagnosis Date  . Complication of anesthesia    states had difficult breathing after extubation in past  . Constipation   . Dizziness   . Frequent nosebleeds    when headaches occur as stated per pt   . Headache(784.0)   . History of urinary tract infection   . No pertinent past medical history   . Shortness of breath    intermittent unsure of cause  . Umbilical hernia   . Yeast infection     Patient Active Problem List   Diagnosis Date Noted  . Anxiousness 10/19/2015  . Obesity (BMI 30-39.9) 10/19/2015  . Cough 10/19/2015  . H/O ventral hernia repair 10/18/2015  . Chronic constipation 08/31/2015  .  Contraception management 10/21/2013  . 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 ANAL FISSURE REPAIR  08/16/2009  . APPENDECTOMY    . CESAREAN SECTION  05/15/2012  . CESAREAN SECTION  12/03/2010  . HERNIA REPAIR    . INSERTION OF MESH N/A 12/20/2012   Procedure: INSERTION OF MESH;  Surgeon: Shelly Rubenstein, MD;  Location: [facility];  Service: General;  Laterality: N/A;  . INSERTION OF MESH N/A 10/18/2015   Procedure: INSERTION OF MESH;  Surgeon: Karie Soda, MD;  Location: [facility];  Service: General;  Laterality: N/A;  . UMBILICAL HERNIA REPAIR N/A 12/20/2012   Procedure: LAPAROSCOPIC UMBILICAL HERNIA;  Surgeon: Shelly Rubenstein, MD;  Location: [facility];  Service: General;  Laterality: N/A;  . VENTRAL HERNIA REPAIR N/A 10/18/2015   Procedure: LAPAROSCOPIC LYSIS OF ADHESIONS, REPAIR OF RECURRENT  VENTRAL WALL INCISIONAL HERNIA  WITH MESH;  Surgeon: Karie Soda, MD;  Location: [facility];  Service: General;  Laterality: N/A;    OB History    Gravida Para Term Preterm AB Living   2 2 2     2    SAB TAB Ectopic Multiple Live Births                   Home Medications    Prior to Admission medications   Medication Sig Start Date End Date Taking? Authorizing  Provider  Bisacodyl (LAXATIVE PO) Take 1 tablet by mouth daily as needed (constipation).   Yes Historical Provider, MD  ibuprofen (ADVIL,MOTRIN) 800 MG tablet Take 1 tablet (800 mg total) by mouth 4 (four) times daily. Patient taking differently: Take 800 mg by mouth 4 (four) times daily as needed for headache or moderate pain.  10/18/15  Yes Karie Soda, MD  benzonatate (TESSALON PERLES) 100 MG capsule Take 1 capsule (100 mg total) by mouth 3 (three) times daily as needed for cough. Patient not taking: Reported on 05/30/2016 10/24/15   Courteney Lyn Mackuen, MD  ciprofloxacin (CIPRO) 250 MG tablet Take 1 tablet (250 mg total) by mouth every 12 (twelve)  hours. Patient not taking: Reported on 05/30/2016 11/01/15   Marny Lowenstein, PA-C  fluconazole (DIFLUCAN) 150 MG tablet Take 1 tab (150 mg) today and 1 tab (150 mg) with completion of antibiotics Patient not taking: Reported on 05/30/2016 11/01/15   Marny Lowenstein, PA-C  guaiFENesin (ROBITUSSIN) 100 MG/5ML liquid Take 5-10 mLs (100-200 mg total) by mouth every 4 (four) hours as needed for cough. Patient not taking: Reported on 05/30/2016 10/24/15   Courteney Lyn Mackuen, MD  guaiFENesin-codeine 100-10 MG/5ML syrup Take 5 mLs by mouth every 6 (six) hours as needed for cough. Patient not taking: Reported on 05/30/2016 10/24/15   Courteney Lyn Mackuen, MD  lactulose (CHRONULAC) 10 GM/15ML solution Take 30 mLs (20 g total) by mouth 2 (two) times daily as needed for mild constipation. Patient not taking: Reported on 05/30/2016 03/11/16   Tobey Grim, MD  methocarbamol (ROBAXIN) 750 MG tablet Take 1 tablet (750 mg total) by mouth 4 (four) times daily as needed (use for muscle cramps/pain). Patient not taking: Reported on 05/30/2016 10/18/15   Karie Soda, MD  predniSONE (DELTASONE) 20 MG tablet Take 2 tablets (40 mg total) by mouth daily with breakfast. Take 60mg  x 2 days, 40mg  x 2 days, 20mg  x 3 days 05/30/16   Kathee Delton, MD  traMADol (ULTRAM) 50 MG tablet Take 1-2 tablets (50-100 mg total) by mouth every 8 (eight) hours as needed. Patient not taking: Reported on 05/30/2016 03/11/16   Tobey Grim, MD    Family History History reviewed. No pertinent family history.  Social History Social History  Substance Use Topics  . Smoking status: Never Smoker  . Smokeless tobacco: Never Used  . Alcohol use No     Allergies   Vancomycin   Review of Systems Review of Systems  Constitutional: Positive for appetite change, chills, fatigue and fever.  HENT: Negative for rhinorrhea.   Eyes: Positive for pain. Negative for photophobia and visual disturbance.  Respiratory: Negative for cough, chest  tightness and shortness of breath.   Cardiovascular: Negative for chest pain.  Gastrointestinal: Positive for abdominal distention, abdominal pain, constipation, diarrhea, nausea and vomiting. Negative for blood in stool.  Endocrine: Negative for polyuria.  Genitourinary: Positive for dysuria, flank pain and pelvic pain. Negative for difficulty urinating and vaginal discharge.  Musculoskeletal: Positive for arthralgias, back pain and myalgias.  Skin: Positive for rash. Negative for wound.  Neurological: Positive for weakness, light-headedness and headaches. Negative for dizziness and syncope.     Physical Exam Updated Vital Signs BP 112/73 (BP Location: Right Arm)   Pulse 93   Temp 98.1 F (36.7 C) (Oral)   Resp 20   Wt 92.1 kg   LMP 05/30/2016   SpO2 99%   BMI 35.98 kg/m   Physical Exam  Constitutional: She is  oriented to person, place, and time. She appears well-developed and well-nourished. No distress.  HENT:  Head: Normocephalic and atraumatic.  Right Ear: External ear normal.  Left Ear: External ear normal.  Nose: Nose normal.  Mouth/Throat: Oropharynx is clear and moist. No oropharyngeal exudate.  Eyes: Conjunctivae and EOM are normal. Pupils are equal, round, and reactive to light. No scleral icterus.  Neck: Normal range of motion. Neck supple.  Cardiovascular: Normal rate, regular rhythm, normal heart sounds and intact distal pulses.   No murmur heard. Pulmonary/Chest: Effort normal and breath sounds normal. No respiratory distress. She has no wheezes. She has no rales. She exhibits no tenderness.  Abdominal: Soft. Bowel sounds are normal. She exhibits no distension and no mass. There is no hepatosplenomegaly. There is tenderness in the right upper quadrant, right lower quadrant and periumbilical area. There is no rigidity, no rebound and no guarding.    Musculoskeletal: Normal range of motion. She exhibits no edema, tenderness or deformity.  Neurological: She is  alert and oriented to person, place, and time. No cranial nerve deficit. She exhibits normal muscle tone.  Skin: Skin is warm and dry. Capillary refill takes less than 2 seconds. Purpura and rash noted. Rash is macular and urticarial. Rash is not papular and not pustular. She is not diaphoretic.     Psychiatric: She has a normal mood and affect. Her behavior is normal.     ED Treatments / Results  Labs (all labs ordered are listed, but only abnormal results are displayed) Labs Reviewed  COMPREHENSIVE METABOLIC PANEL - Abnormal; Notable for the following:       Result Value   Glucose, Bld 115 (*)    BUN <5 (*)    All other components within normal limits  URINALYSIS, ROUTINE W REFLEX MICROSCOPIC (NOT AT [facility]) - Abnormal; Notable for the following:    Hgb urine dipstick LARGE (*)    Bilirubin Urine SMALL (*)    All other components within normal limits  URINE MICROSCOPIC-ADD ON - Abnormal; Notable for the following:    Squamous Epithelial / LPF 0-5 (*)    Bacteria, UA RARE (*)    All other components within normal limits  CBC  I-STAT BETA HCG BLOOD, ED ([facility], [facility], [facility] ONLY)    EKG  EKG Interpretation None       Radiology Dg Abd Acute W/chest  Result Date: 05/30/2016 CLINICAL DATA:  Abdominal pain. EXAM: DG ABDOMEN ACUTE W/ 1V CHEST COMPARISON:  CT 03/14/2016. FINDINGS: Mediastinum and hilar structures normal. Faint densities in the right upper lobe. Repeat PA and lateral chest x-ray with apical lordotic view suggested to further evaluate . No pleural effusion or pneumothorax. Heart size normal. Soft tissues of the abdomen are unremarkable. No bowel distention. No acute bony abnormality. IMPRESSION: 1. Faint density right upper lobe. Repeat PA lateral chest x-ray with apical lordotic chest x-ray suggested for further evaluation. If this density persists nonenhanced chest CT can be obtained to further evaluate. 2.  No acute intra-abdominal abnormality. Electronically Signed   By:  Maisie Fus  Register   On: 05/30/2016 13:39    Procedures Procedures (including critical care time)  Medications Ordered in ED Medications  acetaminophen (TYLENOL) tablet 1,000 mg (not administered)     Initial Impression / Assessment and Plan / ED Course  I have reviewed the triage vital signs and the nursing notes.  Pertinent labs & imaging results that were available during my care of the patient were reviewed by me and considered  in my medical decision making (see chart for details).  Clinical Course   Abdominal pain, acute on chronic: Patient is here with abdominal pain. Etiology currently unknown however cannot rule out gastroenteritis (abdominal pain, diarrhea, vomiting, subjective fever) vs abdominal adhesion pain (seems relatively chronic in nature, previous abdom surgeries). It appears as though patient has a long-standing history of umbilical pain likely secondary to an umbilical hernia which has required 3 previous surgeries.   UA yielded some hematuria without evidence of infection, pregnancy negative, CBC without evidence of leukocytosis or anemia, CMP unremarkable. Chest x-ray and KUB without evidence of significant stool burden or free air. Incidental density noted in right upper lobe.  Patient was provided 1000 mg oral Tylenol for pain. Pain was relatively unchanged after this medication. Patient spends some time reassuring patient that there was no acute process taking place and that this patient should seek further workup by PCP.  Rash, nonspecific:  Patient's rash seems relatively benign at this time. Patient endorsed some pruritic characteristics. Unknown overall cause but due to the history and characteristics of this rash it is likely allergic in nature. Patient provided short course of prednisone and asked to follow-up with PCP. - Prednisone 40 mg daily 5 days   Final Clinical Impressions(s) / ED Diagnoses   Final diagnoses:  Rash and nonspecific skin eruption    Periumbilical abdominal pain    New Prescriptions New Prescriptions   PREDNISONE (DELTASONE) 20 MG TABLET    Take 2 tablets (40 mg total) by mouth daily with breakfast. Take 60mg  x 2 days, 40mg  x 2 days, 20mg  x 3 days     Kathee DeltonIan D Raylea Adcox, MD 05/30/16 [phone]

## 2016-05-30 NOTE — ED Triage Notes (Signed)
Pt presents with c/o rash and hernia pain. She reports onset of rash 3 days ago. The rash is to anterior forearms, armpits, face. She describes as itchy at times. She denies pain. She has not tried anything at home for the rash. No one else in household has the rash. She reports  She reports chronic abd pain related to a hernia. She has had the hernia repaired three times now. The pain increased last night and she was unable to sleep. She reports fevers and chills, anorexia,  n/v, dysuria. She denies any bwel changes. Her abd is tender to palpation around her umbilicus.

## 2016-05-30 NOTE — ED Notes (Signed)
Patient transported to X-ray 

## 2016-06-03 ENCOUNTER — Ambulatory Visit (INDEPENDENT_AMBULATORY_CARE_PROVIDER_SITE_OTHER): Payer: No Typology Code available for payment source | Admitting: Family Medicine

## 2016-06-03 ENCOUNTER — Encounter: Payer: Self-pay | Admitting: Family Medicine

## 2016-06-03 ENCOUNTER — Other Ambulatory Visit (HOSPITAL_COMMUNITY)
Admission: RE | Admit: 2016-06-03 | Discharge: 2016-06-03 | Disposition: A | Discharge status: Home / Self Care | Payer: No Typology Code available for payment source | Source: Ambulatory Visit | Attending: Family Medicine | Admitting: Family Medicine

## 2016-06-03 ENCOUNTER — Ambulatory Visit (HOSPITAL_COMMUNITY)
Admission: RE | Admit: 2016-06-03 | Discharge: 2016-06-03 | Disposition: A | Discharge status: Home / Self Care | Payer: No Typology Code available for payment source | Source: Ambulatory Visit | Attending: Family Medicine | Admitting: Family Medicine

## 2016-06-03 ENCOUNTER — Other Ambulatory Visit: Payer: Self-pay | Admitting: Family Medicine

## 2016-06-03 VITALS — BP 114/65 | HR 81 | Temp 98.2°F | Wt 202.0 lb

## 2016-06-03 DIAGNOSIS — J984 Other disorders of lung: Secondary | ICD-10-CM

## 2016-06-03 DIAGNOSIS — Z113 Encounter for screening for infections with a predominantly sexual mode of transmission: Secondary | ICD-10-CM | POA: Insufficient documentation

## 2016-06-03 DIAGNOSIS — L298 Other pruritus: Secondary | ICD-10-CM

## 2016-06-03 DIAGNOSIS — K5909 Other constipation: Secondary | ICD-10-CM

## 2016-06-03 DIAGNOSIS — N760 Acute vaginitis: Secondary | ICD-10-CM | POA: Insufficient documentation

## 2016-06-03 DIAGNOSIS — Z124 Encounter for screening for malignant neoplasm of cervix: Secondary | ICD-10-CM

## 2016-06-03 DIAGNOSIS — Z01419 Encounter for gynecological examination (general) (routine) without abnormal findings: Secondary | ICD-10-CM | POA: Insufficient documentation

## 2016-06-03 DIAGNOSIS — Z1151 Encounter for screening for human papillomavirus (HPV): Secondary | ICD-10-CM | POA: Insufficient documentation

## 2016-06-03 DIAGNOSIS — N898 Other specified noninflammatory disorders of vagina: Secondary | ICD-10-CM

## 2016-06-03 LAB — POCT WET PREP (WET MOUNT)
Clue Cells Wet Prep Whiff POC: NEGATIVE
TRICHOMONAS WET PREP HPF POC: ABSENT

## 2016-06-03 MED ORDER — FLUCONAZOLE 150 MG PO TABS: 150.0000 mg | ORAL_TABLET | Freq: Once | ORAL | 0 refills | Status: AC

## 2016-06-03 MED ORDER — POLYETHYLENE GLYCOL 3350 17 GM/SCOOP PO POWD: 17.0000 g | Freq: Two times a day (BID) | ORAL | 1 refills | Status: DC

## 2016-06-03 MED ORDER — TRIAMCINOLONE ACETONIDE 0.1 % EX CREA: 1.0000 "application " | TOPICAL_CREAM | Freq: Two times a day (BID) | CUTANEOUS | 0 refills | Status: AC

## 2016-06-03 MED ORDER — DOCUSATE SODIUM 100 MG PO CAPS: 100.0000 mg | ORAL_CAPSULE | Freq: Two times a day (BID) | ORAL | 1 refills | Status: DC

## 2016-06-03 MED ORDER — CLOTRIMAZOLE 1 % EX CREA: 1.0000 "application " | TOPICAL_CREAM | Freq: Two times a day (BID) | CUTANEOUS | 0 refills | Status: AC

## 2016-06-03 NOTE — Progress Notes (Signed)
Date of Visit: 06/03/2016   HPI:  Patient presents to be seen for rash and abdominal pain.  Seen in the ED on 10/20 with periumbilical abdominal pain and rash.  Abdominal pain - had KUB & labs, which were unremarkable. Had blood in urine, though had just started menses according to patient today. Abdominal pain is a little better. Still is quite constipated. Currently using an over the counter laxative, does not know the name of it. Feels nauseated but no vomiting. Having some low back pain, no prior injuries to back. Endorses some dysuria and vaginal itching, ongoing for 1 month. This is the same as when she's had a yeast infection previously.  Rash - given rx for prednisone taper. Has improved some. Rash located on her arms. Was pruritic. Also has rash beneath and in between breasts. Also some dry skin on eyelids  Upper lobe denisty - noted on xray. Repeat PA lateral chest x-ray with apical lordotic chest x-ray suggested for further evaluation, per radiology read.    ROS: See HPI.  PMFSH: history of constipation, obesity, ventral hernia s/p repair  PHYSICAL EXAM: BP 114/65   Pulse 81   Temp 98.2 F (36.8 C) (Oral)   Wt 202 lb (91.6 kg)   LMP 05/28/2016   BMI 35.78 kg/m  Gen: NAD, pleasant, cooeprative HEENT: normocephalic, atraumatic, moist mucous membranes  Heart: regular rate and rhythm, no murmur Lungs: clear to auscultation bilaterally, normal work of breathing  Neuro: alert grossly nonfocal speech normal Abdomen: soft nontender to palpation, normoactive bowel sounds  GU: normal appearing external genitalia without lesions. Vagina is moist with white discharge. Cervix normal in appearance. No cervical motion tenderness or tenderness on bimanual exam. No adnexal masses.  Ext: No appreciable lower extremity edema bilaterally  Skin: mild dry skin on eyelids. Resolving macular rash on arms. Intertriginous erythema between and beneath breasts  ASSESSMENT/PLAN:  Health  maintenance:  -Offered flu shot to patient today, but pt declined.  -pap smear done today during pelvic exam  Rash - unclear etiology on arms, but seems improving. Breast rash is intertriginous candidiasis. Suspect eyelid component is seborrhea - finish course of prednisone - start topical triamcinolone cream on arms - clotrimazole cream for candidal rash between/beneath breasts.  - recommend good facial moisturizer  Vaginal itching: suspect yeast to be the cause of her vaginal itching and dysuria. UA from ED visit not suggestive of UTI. - send wet prep, gc/chlamydia - treat empirically for yeast with diflucan 150mg  x1, repeat in 3 days if not improved - pap today with pelvic exam  Opacity on CXR - obtain repeat films as recommended by radiologist  Chronic constipation Suspect this is the cause of her chronic abdominal pain (that and her several prior surgeries). Abdominal exam is benign today.  Will get on good bowel regimen with standing twice daily colace and miralax. Follow up with me in 2-3 weeks to eval for improvement.   FOLLOW UP: Follow up in 2-3 weeks  for constipation  [name] [name], MD [facility]

## 2016-06-03 NOTE — Patient Instructions (Addendum)
For rash: -finish course of prednisone, just finish the quantity that you have -for arms, start topical triamcinolone cream. Use sparingly, not for long term use as it may thin or lighten the skin -for area between/beanth breasts, use clotrimazole cream for skin yeast infection  For constipation: -take miralax and colace twice daily every single day -follow up with me in 2 weeks to see how this is going  For area on xray: -go get another xray  For vaginal itching: -Pap smear performed today -testing for infections -take diflucan 150mg  once today. Repeat in 3 days if not better  See me in 2-3 weeks  Be well, Dr. [name]

## 2016-06-04 ENCOUNTER — Encounter: Payer: Self-pay | Admitting: Family Medicine

## 2016-06-04 LAB — CYTOLOGY - PAP
CHLAMYDIA, DNA PROBE: NEGATIVE
Diagnosis: NEGATIVE
HPV: NOT DETECTED
NEISSERIA GONORRHEA: NEGATIVE
TRICH (WINDOWPATH): NEGATIVE

## 2016-06-05 NOTE — Assessment & Plan Note (Signed)
Suspect this is the cause of her chronic abdominal pain (that and her several prior surgeries). Abdominal exam is benign today.  Will get on good bowel regimen with standing twice daily colace and miralax. Follow up with me in 2-3 weeks to eval for improvement.

## 2016-06-06 ENCOUNTER — Encounter: Payer: Self-pay | Admitting: Family Medicine

## 2016-07-15 ENCOUNTER — Ambulatory Visit (HOSPITAL_COMMUNITY)
Admission: RE | Admit: 2016-07-15 | Discharge: 2016-07-15 | Disposition: A | Discharge status: Home / Self Care | Payer: No Typology Code available for payment source | Source: Ambulatory Visit | Attending: Family Medicine | Admitting: Family Medicine

## 2016-07-15 ENCOUNTER — Ambulatory Visit (INDEPENDENT_AMBULATORY_CARE_PROVIDER_SITE_OTHER): Payer: Self-pay | Admitting: Family Medicine

## 2016-07-15 VITALS — BP 106/80 | HR 83 | Temp 98.0°F | Wt 208.0 lb

## 2016-07-15 DIAGNOSIS — K5909 Other constipation: Secondary | ICD-10-CM

## 2016-07-15 DIAGNOSIS — G8928 Other chronic postprocedural pain: Secondary | ICD-10-CM

## 2016-07-15 DIAGNOSIS — M25561 Pain in right knee: Secondary | ICD-10-CM | POA: Insufficient documentation

## 2016-07-15 DIAGNOSIS — M25562 Pain in left knee: Secondary | ICD-10-CM | POA: Insufficient documentation

## 2016-07-15 DIAGNOSIS — Z114 Encounter for screening for human immunodeficiency virus [HIV]: Secondary | ICD-10-CM

## 2016-07-15 DIAGNOSIS — G8929 Other chronic pain: Secondary | ICD-10-CM

## 2016-07-15 DIAGNOSIS — Z23 Encounter for immunization: Secondary | ICD-10-CM

## 2016-07-15 LAB — POCT URINE PREGNANCY: Preg Test, Ur: NEGATIVE

## 2016-07-15 NOTE — Patient Instructions (Addendum)
For abdominal pain - sending you back to see the surgeon - this may be something you just have to cope with long term - ok to continue the bisacodyl long term  Tetanus shot today HIV test today  For knee pain - go get knee xrays - use as needed tylenol or ibuprofen (don't take more than directed on bottle) - we can do steroid shots in the future if needed - weight loss will help  Follow up with me in 3 months, sooner if needed  Be well, Dr. [name]

## 2016-07-15 NOTE — Progress Notes (Signed)
Date of Visit: 07/15/2016   HPI:  Patient presents for follow up of abdominal pain.  At last visit scheduled colace & miralax twice daily. Has not been taking this as it did not help. Notes she gets lots of gas with miralax. Has been taking bisacodyl from [employer]. Takes 3 pills daily. Has bowel movement every day with it. Bowel movements are soft, no blood. Does not she feels thirsty a lot. Has continued abdominal pain from prior hernia surgery. Understands that this may just be something she has to deal with long-term. Would like to see surgeon again to be sure there's nothing else to be done.  Knee pain - has pain in both knees. Ongoing since 2009. Used to take medications for it before she came to [place], doesn't know name of medications. Has never had steroid shots. Was told she has likely arthritis where knee bones rub together. No xrays since coming to [place]. Pain responds well to tylenol or ibuprofen  ROS: See HPI.  PMFSH: history of chronic constipation, chronic abdominal pain, obesity  PHYSICAL EXAM: BP 106/80   Pulse 83   Temp 98 F (36.7 C) (Oral)   Wt 208 lb (94.3 kg)   LMP 06/24/2016   BMI 36.85 kg/m  Gen: NAD, pleasant, cooperative HEENT: normocephalic, atraumatic, moist mucous membranes  Heart: regular rate and rhythm no murmur Lungs: clear to auscultation bilaterally normal work of breathing Abdomen: soft. Mildly tender to palpation in mid abdominal area. No peritoneal signs, rebound, or guarding. Normoactive bowel sounds  Neuro: alert grossly nonfocal, speech normal. Ext: bilateral knees with MCL & LCL intact to varus and valgus stressing. No joint effusion appreciable. Negative lachmans bilaterally. Medial joint line tenderness bilaterally  ASSESSMENT/PLAN:  Health maintenance:  -HIV antibody today for routine screening  Chronic constipation Constipation well controlled on current regimen (3 tabs of [employer] bisacodyl daily). Asked her to bring them with her to  next visit to ensure what dosing is (suspect 15mg  total daily, which is a safe dose).  Continues to have abdominal pain following hernia repair. Will refer back to general surgery to ensure nothing else to be done. This may be chronic discomfort that patient has to manage.  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 Discussed role weight management plays in helping OA. Check standing bilateral AP film. Continue as needed tylenol & ibuprofen. Consider steroid shots in future if worsening.  FOLLOW UP: Follow up in 3 mos for routine visit Referring to gen surg for post operative abdominal pain  [name] [name], MD [facility]

## 2016-07-16 LAB — HIV ANTIBODY (ROUTINE TESTING W REFLEX): HIV 1&2 Ab, 4th Generation: NONREACTIVE

## 2016-07-18 ENCOUNTER — Encounter: Payer: Self-pay | Admitting: Family Medicine

## 2016-07-18 DIAGNOSIS — M25562 Pain in left knee: Secondary | ICD-10-CM

## 2016-07-18 DIAGNOSIS — M25561 Pain in right knee: Secondary | ICD-10-CM | POA: Insufficient documentation

## 2016-07-18 NOTE — Assessment & Plan Note (Signed)
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 Discussed role weight management plays in helping OA. Check standing bilateral AP film. Continue as needed tylenol & ibuprofen. Consider steroid shots in future if worsening.

## 2016-07-18 NOTE — Assessment & Plan Note (Addendum)
Constipation well controlled on current regimen (3 tabs of [employer] bisacodyl daily). Asked her to bring them with her to next visit to ensure what dosing is (suspect 15mg  total daily, which is a safe dose).  Continues to have abdominal pain following hernia repair. Will refer back to general surgery to ensure nothing else to be done. This may be chronic discomfort that patient has to manage.

## 2016-09-01 ENCOUNTER — Encounter: Payer: Self-pay | Admitting: Surgery

## 2016-10-06 ENCOUNTER — Encounter: Payer: Self-pay | Admitting: Family Medicine

## 2016-10-06 ENCOUNTER — Ambulatory Visit (INDEPENDENT_AMBULATORY_CARE_PROVIDER_SITE_OTHER): Payer: No Typology Code available for payment source | Admitting: Family Medicine

## 2016-10-06 ENCOUNTER — Encounter: Payer: Self-pay | Admitting: Gastroenterology

## 2016-10-06 VITALS — BP 108/64 | HR 82 | Temp 97.9°F | Ht 63.0 in | Wt 208.0 lb

## 2016-10-06 DIAGNOSIS — K5909 Other constipation: Secondary | ICD-10-CM

## 2016-10-06 DIAGNOSIS — R1084 Generalized abdominal pain: Secondary | ICD-10-CM

## 2016-10-06 MED ORDER — LACTULOSE 10 GM/15ML PO SOLN: 20.0000 g | Freq: Every day | ORAL | 0 refills | Status: DC

## 2016-10-06 NOTE — Patient Instructions (Signed)
Referring to stomach doctor Someone will call you with an appointment  Try lactulose for constipation - sent this in  Follow up with me after you see the stomach doctor  If things get worse go to ER.  Be well, Dr. [name]

## 2016-10-06 NOTE — Progress Notes (Signed)
Date of Visit: 10/06/2016   HPI:  Patient presents to follow up on abdominal pain.  Last Saturday had bad pain in belly. It has eased off some since then but is still present. Saw surgeon in January and was advised to see a gastroenterologist. Not taking her laxative now, as she feels like the pills get stuck in her throat. When she does take it she stools better, otherwise she is very constipated. No blood in stool. Last vomited 2 days ago.  ROS: See HPI.  PMFSH: history of ventral hernia requiring repair, obesity, bilateral knee pain  PHYSICAL EXAM: BP 108/64   Pulse 82   Temp 97.9 F (36.6 C) (Oral)   Ht 5\' 3"  (1.6 m)   Wt 208 lb (94.3 kg)   LMP 09/29/2016   BMI 36.85 kg/m  Gen: no acute distress, pleasant, cooperative HEENT: normocephalic, atraumatic moist mucous membranes  Heart: regular rate and rhythm, no murmur Lungs: clear to auscultation bilaterally normal work of breathing  Abdomen: normoactive bowel sounds. Soft. Mildly tender to palpation throughout, slightly more in RUQ. Neg murphys.   ASSESSMENT/PLAN:  Chronic constipation Continued chronic abdominal pain and significant constipation. Not tolerating PO laxatives due to feeling of getting stuck in throat. Now with some RUQ discomfort. No acute abdomen. Reviewed prior normal RUQ ultrasound. contempleted ordering HIDA scan, but as patient requires GI referral anyways will hold on this for now in order to avoid unnecessary testing. Patient feels quite uncomfortable with this chronic abdominal pain so will enter referral as urgent. Trial of lactulose for constipation (pt feels more able to swallow liquid than pills).  FOLLOW UP: Referring to GI, see me after that.  [name] [name], MD [facility]

## 2016-10-10 NOTE — Assessment & Plan Note (Addendum)
Continued chronic abdominal pain and significant constipation. Not tolerating PO laxatives due to feeling of getting stuck in throat. Now with some RUQ discomfort. No acute abdomen. Reviewed prior normal RUQ ultrasound. contempleted ordering HIDA scan, but as patient requires GI referral anyways will hold on this for now in order to avoid unnecessary testing. Patient feels quite uncomfortable with this chronic abdominal pain so will enter referral as urgent. Trial of lactulose for constipation (pt feels more able to swallow liquid than pills).

## 2016-10-19 ENCOUNTER — Emergency Department (HOSPITAL_BASED_OUTPATIENT_CLINIC_OR_DEPARTMENT_OTHER)
Admission: EM | Admit: 2016-10-19 | Discharge: 2016-10-19 | Disposition: A | Discharge status: Home / Self Care | Payer: No Typology Code available for payment source | Attending: Emergency Medicine | Admitting: Emergency Medicine

## 2016-10-19 ENCOUNTER — Encounter (HOSPITAL_BASED_OUTPATIENT_CLINIC_OR_DEPARTMENT_OTHER): Payer: Self-pay | Admitting: Emergency Medicine

## 2016-10-19 ENCOUNTER — Emergency Department (HOSPITAL_BASED_OUTPATIENT_CLINIC_OR_DEPARTMENT_OTHER): Payer: No Typology Code available for payment source

## 2016-10-19 DIAGNOSIS — K5901 Slow transit constipation: Secondary | ICD-10-CM | POA: Insufficient documentation

## 2016-10-19 DIAGNOSIS — R509 Fever, unspecified: Secondary | ICD-10-CM | POA: Insufficient documentation

## 2016-10-19 DIAGNOSIS — R1033 Periumbilical pain: Secondary | ICD-10-CM

## 2016-10-19 LAB — URINALYSIS, ROUTINE W REFLEX MICROSCOPIC
Bilirubin Urine: NEGATIVE
Glucose, UA: NEGATIVE mg/dL
Hgb urine dipstick: NEGATIVE
Ketones, ur: NEGATIVE mg/dL
NITRITE: NEGATIVE
PROTEIN: NEGATIVE mg/dL
SPECIFIC GRAVITY, URINE: 1.004 — AB (ref 1.005–1.030)
pH: 6 (ref 5.0–8.0)

## 2016-10-19 LAB — COMPREHENSIVE METABOLIC PANEL
ALK PHOS: 63 U/L (ref 38–126)
ALT: 16 U/L (ref 14–54)
ANION GAP: 5 (ref 5–15)
AST: 21 U/L (ref 15–41)
Albumin: 3.9 g/dL (ref 3.5–5.0)
BUN: 7 mg/dL (ref 6–20)
CALCIUM: 9 mg/dL (ref 8.9–10.3)
CO2: 25 mmol/L (ref 22–32)
Chloride: 111 mmol/L (ref 101–111)
Creatinine, Ser: 0.61 mg/dL (ref 0.44–1.00)
Glucose, Bld: 101 mg/dL — ABNORMAL HIGH (ref 65–99)
Potassium: 3.6 mmol/L (ref 3.5–5.1)
SODIUM: 141 mmol/L (ref 135–145)
TOTAL PROTEIN: 7.3 g/dL (ref 6.5–8.1)
Total Bilirubin: 0.1 mg/dL — ABNORMAL LOW (ref 0.3–1.2)

## 2016-10-19 LAB — URINALYSIS, MICROSCOPIC (REFLEX)

## 2016-10-19 LAB — CBC
HCT: 41.4 % (ref 36.0–46.0)
HEMOGLOBIN: 13.5 g/dL (ref 12.0–15.0)
MCH: 27.9 pg (ref 26.0–34.0)
MCHC: 32.6 g/dL (ref 30.0–36.0)
MCV: 85.5 fL (ref 78.0–100.0)
PLATELETS: 214 10*3/uL (ref 150–400)
RBC: 4.84 MIL/uL (ref 3.87–5.11)
RDW: 13.3 % (ref 11.5–15.5)
WBC: 7.9 10*3/uL (ref 4.0–10.5)

## 2016-10-19 LAB — PREGNANCY, URINE: Preg Test, Ur: NEGATIVE

## 2016-10-19 LAB — LIPASE, BLOOD: Lipase: 26 U/L (ref 11–51)

## 2016-10-19 MED ORDER — FENTANYL CITRATE (PF) 100 MCG/2ML IJ SOLN
50.0000 ug | Freq: Once | INTRAMUSCULAR | Status: AC
  Administered 2016-10-19: 50 ug via INTRAVENOUS
  Filled 2016-10-19: qty 2

## 2016-10-19 MED ORDER — ONDANSETRON HCL 4 MG/2ML IJ SOLN: 4.0000 mg | Freq: Once | INTRAMUSCULAR | Status: DC | PRN

## 2016-10-19 MED ORDER — SODIUM CHLORIDE 0.9 % IV BOLUS (SEPSIS)
1000.0000 mL | Freq: Once | INTRAVENOUS | Status: AC
  Administered 2016-10-19: 1000 mL via INTRAVENOUS

## 2016-10-19 MED ORDER — ONDANSETRON HCL 4 MG/2ML IJ SOLN
INTRAMUSCULAR | Status: AC
  Filled 2016-10-19: qty 2

## 2016-10-19 MED ORDER — ONDANSETRON HCL 4 MG/2ML IJ SOLN
4.0000 mg | Freq: Once | INTRAMUSCULAR | Status: AC
  Administered 2016-10-19: 4 mg via INTRAVENOUS

## 2016-10-19 MED ORDER — IOPAMIDOL (ISOVUE-300) INJECTION 61%
100.0000 mL | Freq: Once | INTRAVENOUS | Status: AC | PRN
  Administered 2016-10-19: 100 mL via INTRAVENOUS

## 2016-10-19 NOTE — ED Provider Notes (Signed)
[facility] Provider Note   CSN: [id number] Arrival date & time: 10/19/16  1531  By signing my name below, I, Heidi [name], attest that this documentation has been prepared under the direction and in the presence of Rolan Bucco, MD. Electronically Signed: Leone [name], Scribe. 10/19/16. 4:32 PM.  History   Chief Complaint Chief Complaint  Patient presents with  . Abdominal Pain    The history is provided by the patient. No language interpreter was used.     HPI Comments: Heidi [name] is a 36 y.o. female who presents to the Emergency Department complaining of persistent generalized abdominal pain that worsened 3 days ago. She has associated nausea and subjective fever. She reports having constipation and urinary frequency that is chronic for her. She notes a history of an umbilical and ventral hernia, the latter of which was repaired with mesh insertion in March 2017. She denies vomiting, melena, blood in stool.   Past Medical History:  Diagnosis Date  . Complication of anesthesia    states had difficult breathing after extubation in past  . Constipation   . Dizziness   . Frequent nosebleeds    when headaches occur as stated per pt   . Headache(784.0)   . History of urinary tract infection   . No pertinent past medical history   . Shortness of breath    intermittent unsure of cause  . Umbilical hernia   . Yeast infection     Patient Active Problem List   Diagnosis Date Noted  . Bilateral knee pain 07/18/2016  . Anxiousness 10/19/2015  . Obesity (BMI 30-39.9) 10/19/2015  . Cough 10/19/2015  . H/O ventral hernia repair 10/18/2015  . Chronic constipation 08/31/2015  . Contraception management 10/21/2013  . 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 ANAL FISSURE REPAIR  08/16/2009  . APPENDECTOMY    . CESAREAN SECTION  05/15/2012  . CESAREAN SECTION  12/03/2010  .  HERNIA REPAIR    . INSERTION OF MESH N/A 12/20/2012   Procedure: INSERTION OF MESH;  Surgeon: Shelly Rubenstein, MD;  Location: [facility];  Service: General;  Laterality: N/A;  . INSERTION OF MESH N/A 10/18/2015   Procedure: INSERTION OF MESH;  Surgeon: Karie Soda, MD;  Location: [facility];  Service: General;  Laterality: N/A;  . UMBILICAL HERNIA REPAIR N/A 12/20/2012   Procedure: LAPAROSCOPIC UMBILICAL HERNIA;  Surgeon: Shelly Rubenstein, MD;  Location: [facility];  Service: General;  Laterality: N/A;  . VENTRAL HERNIA REPAIR N/A 10/18/2015   Procedure: LAPAROSCOPIC LYSIS OF ADHESIONS, REPAIR OF RECURRENT  VENTRAL WALL INCISIONAL HERNIA  WITH MESH;  Surgeon: Karie Soda, MD;  Location: [facility];  Service: General;  Laterality: N/A;    OB History    Gravida Para Term Preterm AB Living   2 2 2     2    SAB TAB Ectopic Multiple Live Births                   Home Medications    Prior to Admission medications   Medication Sig Start Date End Date Taking? Authorizing Provider  Bisacodyl (LAXATIVE PO) Take 1 tablet by mouth daily as needed (constipation).    Historical Provider, MD  clotrimazole (LOTRIMIN) 1 % cream Apply 1 application topically 2 (two) times daily. To area beneath and between breasts 06/03/16   Latrelle Dodrill, MD  ibuprofen (ADVIL,MOTRIN) 800 MG tablet  Take 1 tablet (800 mg total) by mouth 4 (four) times daily. Patient taking differently: Take 800 mg by mouth 4 (four) times daily as needed for headache or moderate pain.  10/18/15   Karie Soda, MD  lactulose (CHRONULAC) 10 GM/15ML solution Take 30 mLs (20 g total) by mouth daily. 10/06/16   Latrelle Dodrill, MD  triamcinolone cream (KENALOG) 0.1 % Apply 1 application topically 2 (two) times daily. To rash on arms 06/03/16   Latrelle Dodrill, MD    Family History No family history on file.  Social History Social History  Substance Use Topics  . Smoking status: Never Smoker  . Smokeless tobacco: Never Used  . Alcohol use  No     Allergies   Vancomycin   Review of Systems Review of Systems  Constitutional: Positive for fever (subjective). Negative for chills, diaphoresis and fatigue.  HENT: Negative for congestion, rhinorrhea and sneezing.   Eyes: Negative.   Respiratory: Negative for cough, chest tightness and shortness of breath.   Cardiovascular: Negative for chest pain and leg swelling.  Gastrointestinal: Positive for abdominal pain and nausea. Negative for blood in stool, diarrhea and vomiting.  Genitourinary: Negative for difficulty urinating, flank pain, frequency and hematuria.  Musculoskeletal: Negative for arthralgias and back pain.  Skin: Negative for rash.  Neurological: Negative for dizziness, speech difficulty, weakness, numbness and headaches.     Physical Exam Updated Vital Signs BP (!) 102/52 (BP Location: Right Arm)   Pulse 76   Temp 98.7 F (37.1 C) (Oral)   Resp 16   Ht 5\' 3"  (1.6 m)   Wt 200 lb (90.7 kg)   LMP 09/29/2016   SpO2 100%   BMI 35.43 kg/m   Physical Exam  Constitutional: She is oriented to person, place, and time. She appears well-developed and well-nourished.  HENT:  Head: Normocephalic and atraumatic.  Eyes: Pupils are equal, round, and reactive to light.  Neck: Normal range of motion. Neck supple.  Cardiovascular: Normal rate, regular rhythm and normal heart sounds.   Pulmonary/Chest: Effort normal and breath sounds normal. No respiratory distress. She has no wheezes. She has no rales. She exhibits no tenderness.  Abdominal: Soft. Bowel sounds are normal. There is tenderness. There is no rebound and no guarding.  Abdominal tenderness to right mid abdomen   Musculoskeletal: Normal range of motion. She exhibits no edema.  Lymphadenopathy:    She has no cervical adenopathy.  Neurological: She is alert and oriented to person, place, and time.  Skin: Skin is warm and dry. No rash noted.  Psychiatric: She has a normal mood and affect.  Nursing note and  vitals reviewed.    ED Treatments / Results  DIAGNOSTIC STUDIES: Oxygen Saturation is 100% on RA, normal by my interpretation.    COORDINATION OF CARE: 4:32 PM Discussed treatment plan with pt at bedside and pt agreed to plan.  Labs (all labs ordered are listed, but only abnormal results are displayed) Labs Reviewed  COMPREHENSIVE METABOLIC PANEL - Abnormal; Notable for the following:       Result Value   Glucose, Bld 101 (*)    Total Bilirubin 0.1 (*)    All other components within normal limits  URINALYSIS, ROUTINE W REFLEX MICROSCOPIC - Abnormal; Notable for the following:    Specific Gravity, Urine 1.004 (*)    Leukocytes, UA SMALL (*)    All other components within normal limits  URINALYSIS, MICROSCOPIC (REFLEX) - Abnormal; Notable for the following:    Bacteria,  UA RARE (*)    Squamous Epithelial / LPF 0-5 (*)    All other components within normal limits  LIPASE, BLOOD  CBC  PREGNANCY, URINE    EKG  EKG Interpretation None       Radiology Ct Abdomen Pelvis W Contrast  Result Date: 10/19/2016 CLINICAL DATA:  Worsening periumbilical pain. Status post ventral hernia repair last year with mesh. EXAM: CT ABDOMEN AND PELVIS WITH CONTRAST TECHNIQUE: Multidetector CT imaging of the abdomen and pelvis was performed using the standard protocol following bolus administration of intravenous contrast. CONTRAST:  100mL ISOVUE-300 IOPAMIDOL (ISOVUE-300) INJECTION 61% COMPARISON:  Acute abdominal series May 30, 2016 and CT abdomen and pelvis March 14, 2016. FINDINGS: LOWER CHEST: Lung bases are clear. Included heart size is normal. No pericardial effusion. HEPATOBILIARY: Liver and gallbladder are normal. PANCREAS: Normal. SPLEEN: Normal. ADRENALS/URINARY TRACT: Kidneys are orthotopic, demonstrating symmetric enhancement. No nephrolithiasis, hydronephrosis or solid renal masses. The unopacified ureters are normal in course and caliber. Urinary bladder is well distended and  unremarkable. Normal adrenal glands. STOMACH/BOWEL: The stomach, small and large bowel are normal in course and caliber without inflammatory changes. Moderate retained large bowel stool. No CT findings of adhesions. Normal appendix. VASCULAR/LYMPHATIC: Aortoiliac vessels are normal in course and caliber. No lymphadenopathy by CT size criteria. 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 OTHER: No intraperitoneal free fluid or free air. MUSCULOSKELETAL: Nonacute. Anterior abdominal wall scarring without focal fluid collection, subcutaneous gas or radiopaque foreign bodies. IMPRESSION: Postsurgical changes of the anterior abdominal wall without complication. Moderate retained large bowel stool without bowel obstruction nor acute intra-abdominal/pelvic process. Electronically Signed   By: Awilda Metroourtnay  Bloomer M.D.   On: 10/19/2016 18:07    Procedures Procedures (including critical care time)  Medications Ordered in ED Medications  ondansetron (ZOFRAN) injection 4 mg (0 mg Intravenous Hold 10/19/16 1645)  fentaNYL (SUBLIMAZE) injection 50 mcg (50 mcg Intravenous Given 10/19/16 1639)  ondansetron (ZOFRAN) injection 4 mg (4 mg Intravenous Given 10/19/16 1634)  sodium chloride 0.9 % bolus 1,000 mL (0 mLs Intravenous Stopped 10/19/16 1735)  iopamidol (ISOVUE-300) 61 % injection 100 mL (100 mLs Intravenous Contrast Given 10/19/16 1743)     Initial Impression / Assessment and Plan / ED Course  I have reviewed the triage vital signs and the nursing notes.  Pertinent labs & imaging results that were available during my care of the patient were reviewed by me and considered in my medical decision making (see chart for details).     CT scan does not demonstrate any evidence of recurrent hernia. Patient has chronic constipation which seems to be the etiology of her pain. She has no vomiting and is otherwise well-appearing. She has lactulose to use at home for the  constipation which she will take today. She was encouraged to follow-up with her PCP. She recently has been referred to gastroenterology. Return precautions were given.  Final Clinical Impressions(s) / ED Diagnoses   Final diagnoses:  Slow transit constipation  Periumbilical abdominal pain    New Prescriptions New Prescriptions   No medications on file   I personally performed the services described in this documentation, which was scribed in my presence.  The recorded information has been reviewed and considered.    Rolan BuccoMelanie Lasheika Ortloff, MD 10/19/16 [phone]

## 2016-10-19 NOTE — ED Triage Notes (Signed)
Pt c/o umbilical pain x 3d; hx of multiple surg for hernia repairs

## 2016-10-19 NOTE — ED Notes (Signed)
Pt resting. NAD noted.

## 2016-11-06 ENCOUNTER — Ambulatory Visit (INDEPENDENT_AMBULATORY_CARE_PROVIDER_SITE_OTHER): Payer: Self-pay | Admitting: Gastroenterology

## 2016-11-06 ENCOUNTER — Encounter: Payer: Self-pay | Admitting: Gastroenterology

## 2016-11-06 VITALS — BP 110/74 | HR 80 | Ht 63.25 in | Wt 207.4 lb

## 2016-11-06 DIAGNOSIS — K5909 Other constipation: Secondary | ICD-10-CM

## 2016-11-06 DIAGNOSIS — K432 Incisional hernia without obstruction or gangrene: Secondary | ICD-10-CM

## 2016-11-06 MED ORDER — NA SULFATE-K SULFATE-MG SULF 17.5-3.13-1.6 GM/177ML PO SOLN: 1.0000 | Freq: Once | ORAL | 0 refills | Status: AC

## 2016-11-06 NOTE — Patient Instructions (Signed)
If you are age 36 or older, your body mass index should be between 23-30. Your Body mass index is 36.45 kg/m. If this is out of the aforementioned range listed, please consider follow up with your Primary Care Provider.  If you are age 364 or younger, your body mass index should be between 19-25. Your Body mass index is 36.45 kg/m. If this is out of the aformentioned range listed, please consider follow up with your Primary Care Provider.   You have been scheduled for an endoscopy and colonoscopy. Please follow the written instructions given to you at your visit today. Please pick up your prep supplies at the pharmacy within the next 1-3 days. If you use inhalers (even only as needed), please bring them with you on the day of your procedure. Your physician has requested that you go to www.startemmi.com and enter the access code given to you at your visit today. This web site gives a general overview about your procedure. However, you should still follow specific instructions given to you by our office regarding your preparation for the procedure.  Thank you for choosing [facility]  Dr Amada JupiterHenry Danis III

## 2016-11-06 NOTE — Progress Notes (Signed)
  [facility] Consult Note:  History: Heidi [name] 11/06/2016  Referring physician:  Dr. Steven gross, [facility]  Reason for consult/chief complaint: Abdominal Pain (generalized but mostly on the right side); Nausea (with occasional vomiting); Gastroesophageal Reflux; and Dysphagia (pills getting stuck x 3 months)   Subjective  HPI:  This is a 35-year-old [place] woman referred by Dr. Gross of the surgical service. She speaks English well, but at times also used an Arabic interpreter. Her sister is also present for the entire encounter. Of note, I specifically asked her if there was any religious or cultural obstacle for her to be seen and examined by me today including a rectal exam. She indicated that and had authorized and scheduled this visit and was comfortable with her being seen and examined by me today.  She recalls at least 20 years of chronic constipation that is apparently not improved with use of Dulcolax and MiraLAX. She has abdominal bloating and distention. All of this seems to be worsening a recurrent problem with abdominal wall hernia. She reports having had hernia surgery 3 times, once in [place] in twice with [facility] [place] [facility]. The last surgical report we have is from a year ago, when some abdominal wall mesh was placed by Dr. gross.  She describes infrequent bowel movements, and often when she will sit she will feel the need to strain but very little happen. She might also feel a protrusion in the anal rectal area. There is no rectal bleeding, she does not recall having seen a gastroenterologist in the past and has no recollection of a colonoscopy. She was told by her doctors in [place] that she has "a lazy colon".  In addition, she describes at least several months of dysphagia to pills and less often to solid food. It might feel stuck in the upper chest, and sometimes pills feel like they are in there for hours. She has not  had a bring anything back up, she does have occasional nausea, denies early satiety or weight loss.  Lastly, she reports having had an anal fissure multiple times and had a surgery for it at some point in the past. ROS:  Review of Systems  Constitutional: Positive for fatigue. Negative for appetite change and unexpected weight change.  HENT: Negative for mouth sores and voice change.   Eyes: Negative for pain and redness.  Respiratory: Positive for cough and shortness of breath.   Cardiovascular: Negative for chest pain and palpitations.  Genitourinary: Positive for dysuria. Negative for hematuria.       She feels that she has excessive urination.  As near as I can tell, she does not have any symptoms or urinary retention  Musculoskeletal: Positive for back pain. Negative for arthralgias and myalgias.  Skin: Negative for pallor and rash.  Neurological: Positive for headaches. Negative for weakness.  Hematological: Negative for adenopathy.     Past Medical History: Past Medical History:  Diagnosis Date  . Anal fissure   . Complication of anesthesia    states had difficult breathing after extubation in past  . Constipation   . Dizziness   . Frequent nosebleeds    when headaches occur as stated per pt   . Headache(784.0)   . History of urinary tract infection   . No pertinent past medical history   . Shortness of breath    intermittent unsure of cause  . Umbilical hernia   . Yeast infection      Past Surgical History: Past   Surgical History:  Procedure Laterality Date  . ANAL FISSURE REPAIR  08/16/2009  . APPENDECTOMY    . CESAREAN SECTION  05/15/2012  . CESAREAN SECTION  12/03/2010  . HERNIA REPAIR    . INSERTION OF MESH N/A 12/20/2012   Procedure: INSERTION OF MESH;  Surgeon: Douglas A Blackman, MD;  Location: [facility];  Service: General;  Laterality: N/A;  . INSERTION OF MESH N/A 10/18/2015   Procedure: INSERTION OF MESH;  Surgeon: Steven Gross, MD;  Location: [facility];   Service: General;  Laterality: N/A;  . UMBILICAL HERNIA REPAIR N/A 12/20/2012   Procedure: LAPAROSCOPIC UMBILICAL HERNIA;  Surgeon: Douglas A Blackman, MD;  Location: [facility];  Service: General;  Laterality: N/A;  . VENTRAL HERNIA REPAIR N/A 10/18/2015   Procedure: LAPAROSCOPIC LYSIS OF ADHESIONS, REPAIR OF RECURRENT  VENTRAL WALL INCISIONAL HERNIA  WITH MESH;  Surgeon: Steven Gross, MD;  Location: [facility];  Service: General;  Laterality: N/A;     Family History: Family History  Problem Relation Age of Onset  . Heart disease Father   . Kidney disease Brother     Social History: Social History   Social History  . Marital status: Married    Spouse name: N/A  . Number of children: 2  . Years of education: N/A   Occupational History  . housewife    Social History Main Topics  . Smoking status: Never Smoker  . Smokeless tobacco: Never Used  . Alcohol use No  . Drug use: No  . Sexual activity: Not Currently    Birth control/ protection: None   Other Topics Concern  . None   Social History Narrative   Originally from [place]    Allergies: Allergies  Allergen Reactions  . Vancomycin Itching and Rash    Outpatient Meds: Current Outpatient Prescriptions  Medication Sig Dispense Refill  . Bisacodyl (LAXATIVE PO) Take 1 tablet by mouth daily as needed (constipation).    . clotrimazole (LOTRIMIN) 1 % cream Apply 1 application topically 2 (two) times daily. To area beneath and between breasts 30 g 0  . MAGNESIUM CITRATE PO Take by mouth as needed.    . methocarbamol (ROBAXIN) 500 MG tablet Take 1-2 tablets by mouth every 6 (six) hours.  3  . naproxen (NAPROSYN) 500 MG tablet Take 500 mg by mouth 2 (two) times daily.  5  . triamcinolone cream (KENALOG) 0.1 % Apply 1 application topically 2 (two) times daily. To rash on arms 30 g 0  . Na Sulfate-K Sulfate-Mg Sulf 17.5-3.13-1.6 GM/180ML SOLN Take 1 kit by mouth once. 354 mL 0   No current facility-administered medications for  this visit.       ___________________________________________________________________ Objective   Exam:  BP 110/74 (BP Location: Left Arm, Patient Position: Sitting, Cuff Size: Normal)   Pulse 80   Ht 5' 3.25" (1.607 m) Comment: height measured without shoes  Wt 207 lb 6 oz (94.1 kg)   LMP 10/23/2016   BMI 36.45 kg/m  She was interviewed and examined in the presence of her sister, the female interpreter, and my medical assistant Toni   General: this is a(n) overweight and well-appearing woman   Eyes: sclera anicteric, no redness  ENT: oral mucosa moist without lesions, no cervical or supraclavicular lymphadenopathy, good dentition  CV: RRR without murmur, S1/S2, no JVD, no peripheral edema  Resp: clear to auscultation bilaterally, normal RR and effort noted  GI: soft, mild midline abdominal tenderness over her rectus diastasis., with active bowel sounds.   No guarding or palpable organomegaly noted.  Skin; warm and dry, no rash or jaundice noted  Neuro: awake, alert and oriented x 3. Normal gross motor function and fluent speech Rectal: Normal external, no tenderness on digital exam: No palpable fissure. There is copious soft stool in the rectal vault. There is difficulty getting her to understand out of performing voluntary squeeze. When she was able to do so, does seem mildly decreased. It was difficult to feel the puborectalis contraction. With Valsalva there is not felt to be a good rectal descent.   Assessment: Encounter Diagnoses  Name Primary?  . Chronic constipation Yes  . Recurrent ventral incisional hernia s/p lap repair w mesh 10/19/2015     It does sound like the patient most likely has poor colonic motility. I wonder if there might also be a problem of pelvic floor dysfunction, dyssynergia or rectocele. She thinks perhaps the constipation worsened after childbirth. The dysphagia is somewhat difficult to characterize, especially Wert is worse with pills than it  is with solid food.  Plan:  EGD and colonoscopy. She is agreeable  The benefits and risks of the planned procedure were described in detail with the patient or (when appropriate) their health care proxy.  Risks were outlined as including, but not limited to, bleeding, infection, perforation, adverse medication reaction leading to cardiac or pulmonary decompensation, or pancreatitis (if ERCP).  The limitation of incomplete mucosal visualization was also discussed.  No guarantees or warranties were given.  Possible anorectal motility after that  Trial of Linzess 145 g once daily for 2 weeks  Thank you for the courtesy of this consult.  Please call me with any questions or concerns.  Shekela Goodridge L Danis III  CC: Brittany McIntyre, MD  Dr. Steven gross, [facility]  

## 2016-12-12 ENCOUNTER — Encounter: Payer: Self-pay | Admitting: Gastroenterology

## 2016-12-12 ENCOUNTER — Ambulatory Visit (AMBULATORY_SURGERY_CENTER): Payer: Self-pay | Admitting: Gastroenterology

## 2016-12-12 ENCOUNTER — Other Ambulatory Visit: Payer: Self-pay | Admitting: Gastroenterology

## 2016-12-12 VITALS — BP 117/74 | HR 87 | Temp 100.0°F | Resp 17 | Ht 63.0 in | Wt 207.0 lb

## 2016-12-12 DIAGNOSIS — K5904 Chronic idiopathic constipation: Secondary | ICD-10-CM

## 2016-12-12 DIAGNOSIS — R1319 Other dysphagia: Secondary | ICD-10-CM

## 2016-12-12 MED ORDER — SODIUM CHLORIDE 0.9 % IV SOLN: 500.0000 mL | INTRAVENOUS | Status: AC

## 2016-12-12 MED ORDER — LINACLOTIDE 145 MCG PO CAPS: 145.0000 ug | ORAL_CAPSULE | Freq: Every day | ORAL | 1 refills | Status: AC

## 2016-12-12 NOTE — Op Note (Signed)
[facility] Patient Name: Heidi [name] Procedure Date: 12/12/2016 2:30 PM MRN: [id number] Endoscopist: Sherilyn Cooter [name] , MD Age: 36 Referring MD:  Date of Birth: 10-07-1980 Gender: Female Account #: [id number] Procedure:                Upper GI endoscopy Indications:              dysphagia (mostly pill dysphagia) Medicines:                Monitored Anesthesia Care Procedure:                Pre-Anesthesia Assessment:                           - Prior to the procedure, a History and Physical                            was performed, and patient medications and                            allergies were reviewed. The patient's tolerance of                            previous anesthesia was also reviewed. The risks                            and benefits of the procedure and the sedation                            options and risks were discussed with the patient.                            All questions were answered, and informed consent                            was obtained. Prior Anticoagulants: The patient has                            taken no previous anticoagulant or antiplatelet                            agents. ASA Grade Assessment: II - A patient with                            mild systemic disease. After reviewing the risks                            and benefits, the patient was deemed in                            satisfactory condition to undergo the procedure.                           After obtaining informed consent, the endoscope was  passed under direct vision. Throughout the                            procedure, the patient's blood pressure, pulse, and                            oxygen saturations were monitored continuously. The                            Model GIF-HQ190 [id number]) scope was introduced                            through the mouth, and advanced to the second part                            of duodenum. The  upper GI endoscopy was                            accomplished without difficulty. The patient                            tolerated the procedure well. Scope In: Scope Out: Findings:                 The esophagus was normal.                           The stomach was normal.                           The cardia and gastric fundus were normal on                            retroflexion.                           The examined duodenum was normal. Complications:            No immediate complications. Estimated Blood Loss:     Estimated blood loss: none. Impression:               - Normal esophagus.                           - Normal stomach.                           - Normal examined duodenum.                           - No specimens collected. Recommendation:           - Patient has a contact number available for                            emergencies. The signs and symptoms of potential  delayed complications were discussed with the                            patient. Return to normal activities tomorrow.                            Written discharge instructions were provided to the                            patient.                           - Resume previous diet.                           - Continue present medications.                           - See the other procedure note for documentation of                            additional recommendations. Henry L. Myrtie Neitheranis, MD 12/12/2016 3:00:48 PM This report has been signed electronically.

## 2016-12-12 NOTE — Op Note (Signed)
[facility] Patient Name: Heidi ShortsHanane Grigg Procedure Date: 12/12/2016 2:30 PM MRN: [id number] Endoscopist: Sherilyn CooterHenry L. 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 The patient's tolerance of                            previous anesthesia was also reviewed. The risks                            and benefits of the procedure and the sedation                            options and risks were discussed with the patient.                            All questions were answered, and informed consent                            was obtained. Prior Anticoagulants: The patient has                            taken no previous anticoagulant or antiplatelet                            agents. ASA Grade Assessment: II - A patient with                            mild systemic disease. After reviewing the risks                            and benefits, the patient was deemed in                            satisfactory condition to undergo the procedure.  After obtaining informed consent, the colonoscope                            was passed under direct vision. Throughout the                            procedure, the patient's blood pressure, pulse, and                            oxygen saturations were monitored continuously. The                            Colonoscope was introduced through the anus and                            advanced to the the  cecum, identified by                            appendiceal orifice and ileocecal valve. The                            ileocecal valve, appendiceal orifice, and rectum                            were photographed. The quality of the bowel                            preparation was excellent. The colonoscopy was                            performed without difficulty. The patient tolerated                            the procedure well. The bowel preparation used was                            SUPREP. The quality of the bowel preparation was                            evaluated using the BBPS [facility] Bowel Preparation                            Scale) with scores of: Right Colon = 3, Transverse                            Colon = 3 and Left Colon = 3 (entire mucosa seen                            well with no residual staining, small fragments of                            stool or opaque liquid). The total BBPS score  equals 9. Scope In: 2:47:48 PM Scope Out: 2:57:52 PM Scope Withdrawal Time: 0 hours 6 minutes 31 seconds  Total Procedure Duration: 0 hours 10 minutes 4 seconds  Findings:                 The entire examined colon appeared normal on direct                            and retroflexion views. Very little motility was                            seen. Complications:            No immediate complications. Estimated blood loss:                            None. Estimated Blood Loss:     Estimated blood loss: none. Impression:               - The entire examined colon is normal on direct and                            retroflexion views.                           - No specimens collected.                           There are no anatomical abnormalities other than                            mild redundancy of the colon.                           This appears to be poor overall colonic motility. Recommendation:           - Patient has a contact number  available for                            emergencies. The signs and symptoms of potential                            delayed complications were discussed with the                            patient. Return to normal activities tomorrow.                            Written discharge instructions were provided to the                            patient.                           - Resume previous diet.                           - Continue present medications.                           -  No recommendation at this time regarding repeat                            colonoscopy due to age 36. Henry [name], MD 12/12/2016 3:06:25 PM This report has been signed electronically.

## 2016-12-12 NOTE — Progress Notes (Signed)
Pt's states no medical or surgical changes since previsit or office visit. 

## 2016-12-12 NOTE — Patient Instructions (Signed)
YOU HAD AN ENDOSCOPIC PROCEDURE TODAY: Refer to the procedure report and other information in the discharge instructions given to you for any specific questions about what was found during the examination. If this information does not answer your questions, please call [place] office at [phone] to clarify.  ° °YOU SHOULD EXPECT: Some feelings of bloating in the abdomen. Passage of more gas than usual. Walking can help get rid of the air that was put into your GI tract during the procedure and reduce the bloating. If you had a lower endoscopy (such as a colonoscopy or flexible sigmoidoscopy) you may notice spotting of blood in your stool or on the toilet paper. Some abdominal soreness may be present for a day or two, also. ° °DIET: Your first meal following the procedure should be a light meal and then it is ok to progress to your normal diet. A half-sandwich or bowl of soup is an example of a good first meal. Heavy or fried foods are harder to digest and may make you feel nauseous or bloated. Drink plenty of fluids but you should avoid alcoholic beverages for 24 hours. If you had a esophageal dilation, please see attached instructions for diet.   ° °ACTIVITY: Your care partner should take you home directly after the procedure. You should plan to take it easy, moving slowly for the rest of the day. You can resume normal activity the day after the procedure however YOU SHOULD NOT DRIVE, use power tools, machinery or perform tasks that involve climbing or major physical exertion for 24 hours (because of the sedation medicines used during the test).  ° °SYMPTOMS TO REPORT IMMEDIATELY: °A gastroenterologist can be reached at any hour. Please call [phone]  for any of the following symptoms:  °Following lower endoscopy (colonoscopy, flexible sigmoidoscopy) °Excessive amounts of blood in the stool  °Significant tenderness, worsening of abdominal pains  °Swelling of the abdomen that is new, acute  °Fever of 100° or  higher  °Following upper endoscopy (EGD, EUS, ERCP, esophageal dilation) °Vomiting of blood or coffee ground material  °New, significant abdominal pain  °New, significant chest pain or pain under the shoulder blades  °Painful or persistently difficult swallowing  °New shortness of breath  °Black, tarry-looking or red, bloody stools ° °FOLLOW UP:  °If any biopsies were taken you will be contacted by phone or by letter within the next 1-3 weeks. Call [phone]  if you have not heard about the biopsies in 3 weeks.  °Please also call with any specific questions about appointments or follow up tests. ° °

## 2016-12-12 NOTE — Progress Notes (Signed)
Report to PACU, RN, vss, BBS= Clear.  

## 2016-12-15 ENCOUNTER — Telehealth: Payer: Self-pay

## 2016-12-15 NOTE — Telephone Encounter (Signed)
  Follow up Call-  Call back number 12/12/2016  Post procedure Call Back phone  # [phone] or [phone] Husband's phone-leave message on this one  Permission to leave phone message Yes  Some recent data might be hidden     Patient questions:  Do you have a fever, pain , or abdominal swelling? No. Pain Score  0 *  Have you tolerated food without any problems? Yes.    Have you been able to return to your normal activities? Yes.    Do you have any questions about your discharge instructions: Diet   No. Medications  No. Follow up visit  No.  Do you have questions or concerns about your Care? Yes.  Patient has been constipated, said MD was giving her a prescription for this so she was not too worried about it. I advised her to drink plenty of fluids and to increase her fiber. I also told her to let us know if there were any problems.  Actions: * If pain score is 4 or above: No action needed, pain <4.

## 2017-08-14 ENCOUNTER — Encounter (HOSPITAL_COMMUNITY): Payer: Self-pay

## 2017-08-14 ENCOUNTER — Other Ambulatory Visit: Payer: Self-pay

## 2017-08-14 DIAGNOSIS — Z5321 Procedure and treatment not carried out due to patient leaving prior to being seen by health care provider: Secondary | ICD-10-CM | POA: Insufficient documentation

## 2017-08-14 DIAGNOSIS — R109 Unspecified abdominal pain: Secondary | ICD-10-CM | POA: Insufficient documentation

## 2017-08-14 LAB — COMPREHENSIVE METABOLIC PANEL
ALK PHOS: 70 U/L (ref 38–126)
ALT: 16 U/L (ref 14–54)
ANION GAP: 6 (ref 5–15)
AST: 21 U/L (ref 15–41)
Albumin: 3.7 g/dL (ref 3.5–5.0)
BUN: 13 mg/dL (ref 6–20)
CALCIUM: 8.8 mg/dL — AB (ref 8.9–10.3)
CO2: 23 mmol/L (ref 22–32)
Chloride: 109 mmol/L (ref 101–111)
Creatinine, Ser: 0.61 mg/dL (ref 0.44–1.00)
GFR calc non Af Amer: >60 mL/min (ref >60)
Glucose, Bld: 109 mg/dL — ABNORMAL HIGH (ref 65–99)
POTASSIUM: 3.5 mmol/L (ref 3.5–5.1)
SODIUM: 138 mmol/L (ref 135–145)
Total Bilirubin: 0.4 mg/dL (ref 0.3–1.2)
Total Protein: 7 g/dL (ref 6.5–8.1)

## 2017-08-14 LAB — CBC
HEMATOCRIT: 38.3 % (ref 36.0–46.0)
HEMOGLOBIN: 12.4 g/dL (ref 12.0–15.0)
MCH: 27.1 pg (ref 26.0–34.0)
MCHC: 32.4 g/dL (ref 30.0–36.0)
MCV: 83.8 fL (ref 78.0–100.0)
Platelets: 242 10*3/uL (ref 150–400)
RBC: 4.57 MIL/uL (ref 3.87–5.11)
RDW: 13.4 % (ref 11.5–15.5)
WBC: 11 10*3/uL — ABNORMAL HIGH (ref 4.0–10.5)

## 2017-08-14 LAB — I-STAT BETA HCG BLOOD, ED (MC, WL, AP ONLY)

## 2017-08-14 LAB — LIPASE, BLOOD: LIPASE: 29 U/L (ref 11–51)

## 2017-08-14 NOTE — ED Triage Notes (Signed)
Pt c/o abdominal pain that radiates from her R side around to her left and back to her back. She has a history of 3 hernia surgeries, the last one being in March of 2017. She states that the pain started around 3p today. No N/V/D. A&Ox4.

## 2017-08-15 ENCOUNTER — Emergency Department (HOSPITAL_COMMUNITY)
Admission: EM | Admit: 2017-08-15 | Discharge: 2017-08-15 | Disposition: A | Discharge status: Home / Self Care | Payer: No Typology Code available for payment source | Attending: Emergency Medicine | Admitting: Emergency Medicine

## 2017-08-17 ENCOUNTER — Encounter (HOSPITAL_COMMUNITY): Payer: Self-pay

## 2017-08-17 ENCOUNTER — Other Ambulatory Visit: Payer: Self-pay

## 2017-08-17 ENCOUNTER — Emergency Department (HOSPITAL_COMMUNITY)
Admission: EM | Admit: 2017-08-17 | Discharge: 2017-08-17 | Disposition: A | Discharge status: Home / Self Care | Payer: No Typology Code available for payment source | Attending: Emergency Medicine | Admitting: Emergency Medicine

## 2017-08-17 DIAGNOSIS — R109 Unspecified abdominal pain: Secondary | ICD-10-CM | POA: Insufficient documentation

## 2017-08-17 DIAGNOSIS — Z5321 Procedure and treatment not carried out due to patient leaving prior to being seen by health care provider: Secondary | ICD-10-CM | POA: Insufficient documentation

## 2017-08-17 LAB — CBC
HEMATOCRIT: 40.7 % (ref 36.0–46.0)
Hemoglobin: 13.1 g/dL (ref 12.0–15.0)
MCH: 27 pg (ref 26.0–34.0)
MCHC: 32.2 g/dL (ref 30.0–36.0)
MCV: 83.7 fL (ref 78.0–100.0)
PLATELETS: 202 10*3/uL (ref 150–400)
RBC: 4.86 MIL/uL (ref 3.87–5.11)
RDW: 13.3 % (ref 11.5–15.5)
WBC: 7.9 10*3/uL (ref 4.0–10.5)

## 2017-08-17 LAB — COMPREHENSIVE METABOLIC PANEL
ALBUMIN: 3.8 g/dL (ref 3.5–5.0)
ALT: 18 U/L (ref 14–54)
AST: 21 U/L (ref 15–41)
Alkaline Phosphatase: 71 U/L (ref 38–126)
Anion gap: 5 (ref 5–15)
BUN: 10 mg/dL (ref 6–20)
CHLORIDE: 111 mmol/L (ref 101–111)
CO2: 23 mmol/L (ref 22–32)
CREATININE: 0.51 mg/dL (ref 0.44–1.00)
Calcium: 9.1 mg/dL (ref 8.9–10.3)
GFR calc Af Amer: >60 mL/min (ref >60)
GLUCOSE: 108 mg/dL — AB (ref 65–99)
POTASSIUM: 3.6 mmol/L (ref 3.5–5.1)
SODIUM: 139 mmol/L (ref 135–145)
Total Bilirubin: 0.7 mg/dL (ref 0.3–1.2)
Total Protein: 7.3 g/dL (ref 6.5–8.1)

## 2017-08-17 LAB — LIPASE, BLOOD: LIPASE: 29 U/L (ref 11–51)

## 2017-08-17 LAB — I-STAT BETA HCG BLOOD, ED (MC, WL, AP ONLY): I-stat hCG, quantitative: <5 m[IU]/mL (ref <5)

## 2017-08-17 NOTE — ED Triage Notes (Signed)
Patient reports left mid abdominal pain, left flank pain, and urinary frequency x 10 days.

## 2018-04-13 LAB — POC URINALYSIS, CHEMISTRY
Bilirubin, Urine, POC: NEGATIVE
Glucose, UA POC: NEGATIVE mg/dL
Ketones, Urine, POC: NEGATIVE mg/dL
Leukocytes, UA: NEGATIVE
Nitrate, UA POC: NEGATIVE
Protein, Urine, POC: NEGATIVE
Specific Gravity, Urine, POC: 1.01 (ref 1.003–1.035)
UROBILIN U POC: 0.2 EU/dL
pH, Urine, POC: 6 (ref 4.5–8.0)

## 2018-04-13 LAB — BASIC METABOLIC PANEL
Anion Gap: 10 mmol/L (ref 2–17)
BUN: 6 mg/dL (ref 6–20)
CO2: 22 mmol/L (ref 22–29)
Calcium: 8.8 mg/dL (ref 8.6–10.0)
Chloride: 106 mmol/L (ref 98–107)
Creatinine: 0.6 mg/dL (ref 0.5–0.9)
GFR African American: 136 mL/min/{1.73_m2} (ref >=90)
GFR Non-African American: 117 mL/min/{1.73_m2} (ref >=90)
Glucose: 92 mg/dL (ref 70–99)
Potassium: 3.9 mmol/L (ref 3.5–5.3)
Sodium: 138 mmol/L (ref 135–145)

## 2018-04-13 LAB — POC PREGNANCY UR-QUAL: Preg Test, Ur: NEGATIVE

## 2018-04-13 NOTE — Nursing Note (Signed)
Medication Administration Follow Up-Text       Medication Administration Follow Up Entered On:  04/13/2018 18:14 EDT    Performed On:  04/13/2018 18:14 EDT by Aquilla Hacker, RN, Anderson Malta      Intervention Information:     ondansetron  Performed by Aquilla Hacker, RN, Anderson Malta on 04/13/2018 17:58:00 EDT       ondansetron,4mg   IV Push,Antecubital, Right       Med Response   ED Medication Response :   No adverse reaction, Symptoms improved   Pasero Opioid Induced Sedation Scale :   1 = Awake and alert   Aquilla Hacker RN, Anderson Malta - 04/13/2018 18:14 EDT

## 2019-12-26 LAB — CBC WITH AUTO DIFFERENTIAL
Absolute Baso #: 0.1 10*3/uL (ref 0.0–0.2)
Absolute Eos #: 0 10*3/uL (ref 0.0–0.5)
Absolute Lymph #: 2.4 10*3/uL (ref 1.0–3.2)
Absolute Mono #: 0.5 10*3/uL (ref 0.3–1.0)
Basophils %: 1 % (ref 0.0–2.0)
Eosinophils %: 0.6 % (ref 0.0–7.0)
Hematocrit: 38.7 % (ref 34.0–47.0)
Hemoglobin: 12.1 g/dL (ref 11.5–15.7)
Immature Grans (Abs): 0.02 10*3/uL (ref 0.00–0.06)
Immature Granulocytes: 0.3 % (ref 0.1–0.6)
Lymphocytes: 35.2 % (ref 15.0–45.0)
MCH: 25.3 pg — ABNORMAL LOW (ref 27.0–34.5)
MCHC: 31.3 g/dL — ABNORMAL LOW (ref 32.0–36.0)
MCV: 80.8 fL — ABNORMAL LOW (ref 81.0–99.0)
MPV: 11.5 fL (ref 7.2–13.2)
Monocytes: 7.6 % (ref 4.0–12.0)
NRBC Absolute: 0 10*3/uL (ref 0.000–0.012)
NRBC Automated: 0 % (ref 0.0–0.2)
Neutrophils %: 55.3 % (ref 42.0–74.0)
Neutrophils Absolute: 3.8 10*3/uL (ref 1.6–7.3)
Platelets: 201 10*3/uL (ref 140–440)
RBC: 4.79 x10e6/mcL (ref 3.60–5.20)
RDW: 15.6 % (ref 11.0–16.0)
WBC: 6.9 10*3/uL (ref 3.8–10.6)

## 2019-12-26 LAB — URINALYSIS W/ RFLX MICROSCOPIC
Bilirubin Urine: NEGATIVE
Blood, Urine: NEGATIVE
Glucose, UA: NEGATIVE mg/dL
Ketones, Urine: NEGATIVE mg/dL
Leukocyte Esterase, Urine: NEGATIVE
Nitrite, Urine: NEGATIVE
Protein, UA: NEGATIVE
Specific Gravity, UA: 1.025 (ref 1.003–1.035)
Urobilinogen, Urine: 0.2 EU/dL
pH, UA: 6 (ref 4.5–8.0)

## 2019-12-26 LAB — COMPREHENSIVE METABOLIC PANEL
ALT: 18 U/L (ref 0–33)
AST: 19 U/L (ref 0–32)
Albumin/Globulin Ratio: 1.22 mmol/L (ref 1.00–2.00)
Albumin: 3.9 g/dL (ref 3.5–5.2)
Alk Phosphatase: 72 U/L (ref 35–117)
Anion Gap: 8 mmol/L (ref 2–17)
BUN: 7 mg/dL (ref 6–20)
CO2: 23 mmol/L (ref 22–29)
Calcium: 8.9 mg/dL (ref 8.6–10.0)
Chloride: 108 mmol/L — ABNORMAL HIGH (ref 98–107)
Creatinine: 0.5 mg/dL (ref 0.5–1.0)
GFR African American: 153 mL/min/{1.73_m2} (ref >=90)
GFR Non-African American: 132 mL/min/{1.73_m2} (ref >=90)
Globulin: 3.2 g/dL (ref 1.9–4.4)
Glucose: 93 mg/dL (ref 70–99)
OSMOLALITY CALCULATED: 275 mOsm/kg (ref 270–287)
Potassium: 4.3 mmol/L (ref 3.5–5.3)
Sodium: 139 mmol/L (ref 135–145)
Total Bilirubin: 0.2 mg/dL (ref 0.00–1.20)
Total Protein: 7.1 g/dL (ref 6.4–8.3)

## 2019-12-26 LAB — WET PREP, GENITAL
CLUE CELLS WP, TEST92: ABSENT
Trich, Wet Prep: ABSENT
WBC, Wet Prep: >5 — AB

## 2019-12-26 LAB — POC PREGNANCY UR-QUAL: Preg Test, Ur: NEGATIVE

## 2019-12-26 LAB — SMEAR FUNGUS: FINAL REPORT: NONE SEEN

## 2019-12-26 NOTE — ED Notes (Signed)
 ED Patient Summary       ;       [facility] Emergency Department  [address] [place] [address]  [phone]  Discharge Instructions (Patient)  _______________________________________     Name: Sabrina Rivers, Sabrina Rivers  DOB: Jun 09, 1981                   MRN: [id number]                   FIN: [id number]  Reason For Visit: Pelvic pain; ABD PAIN  Final Diagnosis: Pelvic pain; Vaginal discharge     Visit Date: 12/26/2019 11:29:00  Address: [address] [place] [address]  Phone: [phone]     Emergency Department Providers:         Primary Physician:            [facility] would like to thank you for allowing us  to assist you with your healthcare needs. The following includes patient education materials and information regarding your injury/illness.     Follow-up Instructions:  You were seen today on an emergency basis. Please contact your primary care doctor for a follow up appointment. If you received a referral to a specialist doctor, it is important you follow-up as instructed.    It is important that you call your follow-up doctor to schedule and confirm the location of your next appointment. Your doctor may practice at multiple locations. The office location of your follow-up appointment may be different to the one written on your discharge instructions.    If you do not have a primary care doctor, please call [phone] for help in finding a [facility] Provider. For help in finding a specialist doctor, please call [phone].    The [facility] "Ask a Nurse" line in staffed by Registered Nurses and is a free service to the community. We are available Monday - Friday from 8am to 5pm to answer your questions about your health. Please call [phone].    If your condition gets worse before your follow-up with your primary care doctor or specialist, please return to the Emergency Department.        Coronavirus 2019 (COVID-19) Reminders:      Patients aged 85 and older, people with increased risk for severe COVID-19 disease, or frontline workers with increased occupational risk can make an appointment for a COVID-19 vaccine. Patients can contact their [facility] doctors' offices to schedule an appointment to receive the COVID-19 vaccine at the [facility] or send us  an email at [email or web address]. Patients who do not have a [facility] physician can call [phone] to schedule vaccination appointments.            Scan this code with your phone camera to send an email to the address above.          Follow Up Appointments:  Primary Care Provider:      Name: KERRI MARLOU REACH      Phone: [phone]                 With: Address: When:   MONICA MITCHUM, Physician - OB/GYN [address], [address] [place], [place] [address]  [phone] Business (1) Within 1 week   Comments:   Take over-the-counter ibuprofen and stool softeners. Return to the ED if any worsening pain  Printed Prescriptions:    Patient Education Materials:  Discharge Orders          Discharge Patient 12/26/19 14:01:00 EDT         Comment:      Pelvic Pain, Female, Easy-to-Read     Pelvic Pain, Female    Pelvic pain is pain felt below the belly button and between your hips. It can be caused by many different things. It is important to get help right away. This is especially true for severe, sharp, or unusual pain that comes on suddenly.       HOME CARE     Only take medicine as told by your doctor.     Rest as told by your doctor.     Eat a healthy diet, such as fruits, vegetables, and lean meats.      Drink enough fluids to keep your pee (urine) clear or pale yellow, or as told.     Avoid sex (intercourse) if it causes pain.     Apply warm or cold packs to your lower belly (abdomen). Use the type of pack that helps the pain.     Avoid situations that cause you stress.     Keep a journal to track your pain. Write  down:     ? When the pain started.    ? Where it is located.    ? If there are things that seem to be related to the pain, such as food or your period.     Follow up with your doctor as told.    GET HELP RIGHT AWAY IF:     You have heavy bleeding from the vagina.     You have more pelvic pain.     You feel lightheaded or pass out (faint).     You have chills.     You have pain when you pee or have blood in your pee.     You cannot stop having watery poop (diarrhea).     You cannot stop throwing up (vomiting).     You have a fever or lasting symptoms for more than 3 days.     You have a fever and your symptoms suddenly get worse.     You are being physically or sexually abused.     Your medicine does not help your pain.     You have fluid (discharge) coming from your vagina that is not normal.    MAKE SURE YOU:     Understand these instructions.     Will watch your condition.     Will get help if you are not doing well or get worse.    This information is not intended to replace advice given to you by your health care provider. Make sure you discuss any questions you have with your health care provider.    Document Released: 01/14/2008 Document Revised: 08/18/2014 Document Reviewed: 05/18/2015  Elsevier Interactive Patient Education ?2016 [employer]         Allergy Info: vancomycin     Medication Information:  [facility] ED Physicians provided you with a complete list of medications post discharge, if you have been instructed to stop taking a medication please ensure you also follow up with this information to your Primary Care Physician.  Unless otherwise noted, patient will continue to take medications as prescribed prior to the Emergency Room visit.  Any specific questions regarding your chronic medications and dosages should be discussed with your physician(s)  and pharmacist.          No Medications Documented      Medications Administered During Visit:              Medication Dose Route    azithromycin 1000 mg Oral   ceftriaxone 500 mg IM          Major Tests and Procedures:  The following procedures and tests were performed during your ED visit.  COMMON PROCEDURES%>  COMMON PROCEDURES COMMENTS%>          Laboratory Orders  Name Status Details   .Preg U POC Completed Urine, RT, RT - Routine, Collected, 12/26/19 12:42:00 EDT, Nurse collect, 12/26/19 12:42:00 US Robinette, RAL POC Login   CBCDIFF Completed Blood, Stat, ST - Stat, 12/26/19 12:56:00 EDT, 12/26/19 12:56:00 EDT, Nurse collect, GLADIS GEE CHARLES-PA, Print label Y/N   CMP Completed Blood, Stat, ST - Stat, 12/26/19 12:56:00 EDT, 12/26/19 12:56:00 EDT, Nurse collect, GLADIS GEE CHARLES-PA, Print label Y/N   CT GC Genital Ordered Genital, Stat, ST - Stat, 12/26/19 12:56:00 EDT, Once, 12/26/19 12:56:00 EDT, Nurse collect   KOH Completed Vaginal, Stat, ST - Stat, 12/26/19 12:56:00 EDT, Once, 12/26/19 12:56:00 EDT, Nurse collect   UA Rflx Mic Completed Urine, Clean Catch, Stat, ST - Stat, 12/26/19 12:31:00 EDT, Once, 12/26/19 12:31:00 EDT, Nurse collect, GLADIS GEE CHARLES-PA, Print label Y/N   Wet Prep Completed Vaginal, Stat, ST - Stat, 12/26/19 12:56:00 EDT, 12/26/19 12:56:00 EDT, Nurse collect, GLADIS GEE CHARLES-PA, Print label Y/N   Irwin Jean Completed Blood, Stat, ST - Stat, Collected, 12/26/19 13:17:00 EDT [id number], 12/26/19 13:17:00 EDT, Nurse collect, Venous Draw, 12/26/19 13:24:00 EDT, SF CP Login, MARTIN,  TYLER CHARLES-PA, Print label Y/N, sf_accession_2, 4 mL Landy Op/*[id number]*/, Complete               Radiology Orders  No radiology orders were placed.              Patient Care Orders  Name Status Details   Discharge Patient Ordered 12/26/19 14:01:00 EDT   ED Assessment Adult Completed 12/26/19 11:38:33 EDT, 12/26/19 11:38:33 EDT   ED Secondary Triage Completed 12/26/19 11:38:33 EDT, 12/26/19 11:38:33 EDT   ED Triage Adult Completed 12/26/19 11:29:25 EDT, 12/26/19 11:29:25 EDT   POC-Urine Pregnancy Test collect  Completed 12/26/19 12:31:00 EDT, Once, 12/26/19 12:31:00 EDT   Pelvic Exam Setup Completed 12/26/19 12:56:00 EDT, Once, 12/26/19 12:56:00 EDT       ---------------------------------------------------------------------------------------------------------------------  [facility]) encourages you to self-enroll in the [facility] Patient Portal.  [facility] Patient Portal will allow you to manage your personal health information securely from your own electronic device now and in the future.  To begin your Patient Portal enrollment process, please visit [email or web address] Click on "Sign up now" under [facility].  If you find that you need additional assistance on the [facility] Patient Portal or need a copy of your medical records, please call the [facility] Medical Records Office at [phone].  Comment:

## 2019-12-26 NOTE — ED Provider Notes (Signed)
Pelvic pain        Patient:   Sabrina Rivers, Sabrina Rivers             MRN: [id number]            FIN: [id number]               Age:   39 years     Sex:  Female     DOB:  05/28/1991   Associated Diagnoses:   Pelvic pain   Author:   Gerome Apley CHARLES-PA      Basic Information   Additional information: Chief Complaint from Nursing Triage Note   Chief Complaint  Chief Complaint: pt c/o pelvic pain/pressure that has been going on for a while but worse today, she states when she wipes she feels like something is "falling out." Denies pregnancy, she is in no distress (12/26/19 11:34:00).      History of Present Illness   Patient presents with complaints of "pressure" in her pelvic area which has been ongoing for the last couple of days.  She reports that she is had no bleeding, and her last menstrual period was 3 weeks ago.  She has had 2 prior C-sections 8 & 9 years ago.  She admits to some discharge, dysuria, lower back pain, and vomiting.  She denies any fever.        Review of Systems   Constitutional symptoms:  No fever, no chills, no sweats, no generalized weakness.    Skin symptoms:  No rash, no lesion.    Eye symptoms:  Vision unchanged, no pain, no discharge.    ENMT symptoms:  No ear pain, no sore throat, no nasal congestion.    Respiratory symptoms:  No shortness of breath, no cough.    Cardiovascular symptoms:  No chest pain, no palpitations, no syncope.    Gastrointestinal symptoms:  Abdominal pain, pelvic, pressure, nausea, vomiting, No diarrhea,    Genitourinary symptoms:  Dysuria, vaginal discharge, No vaginal bleeding,    Musculoskeletal symptoms:  Back pain.   Neurologic symptoms:  No headache, no dizziness.       Health Status   Allergies:    Allergic Reactions (Selected)  Severe  Vancomycin- Redness..      Past Medical/ Family/ Social History   Problem list:    Active Problems (1)  No Chronic Problems   .      Physical Examination               Vital Signs   Vital Signs   12/26/2019 11:34 EDT Systolic Blood  Pressure 149 mmHg  HI    Diastolic Blood Pressure 87 mmHg    Temperature Oral 36.7 degC    Heart Rate Monitored 78 bpm    Respiratory Rate 18 br/min    SpO2 100 %   .   Basic Oxygen Information   12/26/2019 11:34 EDT Oxygen Therapy Room air    SpO2 100 %   .   General:  Alert, no acute distress.    Skin:  Warm, dry.    Cardiovascular:  Regular rate and rhythm, No murmur, Normal peripheral perfusion.    Respiratory:  Lungs are clear to auscultation, respirations are non-labored.    Gastrointestinal:  Soft, Nontender, Non distended.    Genitourinary:  Normal external genitalia, no lesions, Speculum exam: Moderate, discharge green, tenderness, no bleeding, no foreign body, not prolapse, Bimanual exam: Normal.    Back:  Nontender, Normal range of motion, No CVAT.  Musculoskeletal:  Normal ROM, normal strength, no tenderness, no swelling.    Psychiatric:  Cooperative.      Medical Decision Making   Differential Diagnosis:  Pelvic pain, urinary tract infection, pelvic inflammatory disease, dysmenorrhea, vaginal discharge, Uterine prolapse, cystocele, rectocele.    Tests pending:  Gonorrhea-Chlamydia.   Notes:  Patient with green vaginal discharge on exam.  Will treat empirically until results return.  No mass palpated on exam.  Labs within normal limits.  UA is clean.  Will have patient follow-up with OB for further diagnosis and treatment..      Impression and Plan   Diagnosis   Pelvic pain (ICD10-CM R10.2, Discharge, Medical)   Plan   Condition: Stable.    Disposition: Discharged.    Patient was given the following educational materials: Pelvic Pain, Female, Easy-to-Read, Pelvic Pain, Female, Easy-to-Read.    Follow up with: MONICA MITCHUM, Physician - OB/GYN Within 1 week, MONICA MITCHUM, Physician - OB/GYN Within 1 week, MONICA MITCHUM, Physician - OB/GYN Within 1 week Take over-the-counter ibuprofen and stool softeners.  Return to the ED if any worsening pain.    Counseled: Patient, Family, Regarding diagnosis,  Regarding diagnostic results, Regarding treatment plan, Patient indicated understanding of instructions.    Signature Line     Electronically Signed on 12/26/2019 02:00 PM EDT   ________________________________________________   Jesse Fall CHARLES-PA      Electronically Signed on 12/27/2019 11:12 AM EDT   ________________________________________________   Mickel Duhamel, MD, Marijo Conception            Modified by: Jesse Fall CHARLES-PA on 12/26/2019 02:00 PM EDT

## 2019-12-26 NOTE — Discharge Summary (Signed)
 ED Clinical Summary                     [facility]  [address]  [place], [place], [address]  [phone]          PERSON INFORMATION  Name: Sabrina Rivers, Sabrina Rivers Age:  39 Years DOB: 16-Jun-1981   Sex: Female Language: English PCP: KERRI MARLOU REACH   Marital Status: Married Phone: [phone] Med Service: MED-Medicine   MRN: [id number] Acct# [id number] Arrival: 12/26/2019 11:29:00   Visit Reason: Pelvic pain; ABD PAIN Acuity: 4 LOS: 000 03:10   Address:    [address] [place] [address]   Diagnosis:    Pelvic pain; Vaginal discharge  Medications:    Medications Administered During Visit:                Medication Dose Route   azithromycin 1000 mg Oral   ceftriaxone 500 mg IM               Allergies      vancomycin (Redness)      Major Tests and Procedures:  The following procedures and tests were performed during your ED visit.  COMMON PROCEDURES%>  COMMON PROCEDURES COMMENTS%>                PROVIDER INFORMATION               Provider Role Assigned Sampson Scot [facility] ED Nurse 12/26/2019 11:43:20    GLADIS GEE CHARLES-PA ED MidLevel 12/26/2019 12:22:57        Attending Physician:  GLADIS GEE CHARLES-PA      Admit Doc  GLADIS GEE CHARLES-PA     Consulting Doc       VITALS INFORMATION  Vital Sign Triage Latest   Temp Oral ORAL_1%> ORAL%>   Temp Temporal TEMPORAL_1%> TEMPORAL%>   Temp Intravascular INTRAVASCULAR_1%> INTRAVASCULAR%>   Temp Axillary AXILLARY_1%> AXILLARY%>   Temp Rectal RECTAL_1%> RECTAL%>   02 Sat 100 % 99 %   Respiratory Rate RATE_1%> RATE%>   Peripheral Pulse Rate PULSE RATE_1%> PULSE RATE%>   Apical Heart Rate HEART RATE_1%> HEART RATE%>   Blood Pressure BLOOD PRESSURE_1%>/ BLOOD PRESSURE_1%>87 mmHg BLOOD PRESSURE%> / BLOOD PRESSURE%>89 mmHg                 Immunizations      No Immunizations Documented This Visit          DISCHARGE INFORMATION   Discharge Disposition: H Outpt-Sent Home   Discharge Location:  Home   Discharge Date and Time:   12/26/2019 14:39:00   ED Checkout Date and Time:  12/26/2019 14:39:00     DEPART REASON INCOMPLETE INFORMATION               Depart Action Incomplete Reason   Interactive View/I&O Recently assessed               Problems      Active           No Chronic Problems              Smoking Status      No Smoking Status Documented         PATIENT EDUCATION INFORMATION  Instructions:     Pelvic Pain, Female, Easy-to-Read     Follow up:                   With: Address: When:  MONICA MITCHUM, Physician - OB/GYN [address] [place], [place] [address]  [phone] Business (1) Within 1 week   Comments:   Take over-the-counter ibuprofen and stool softeners. Return to the ED if any worsening pain              ED PROVIDER DOCUMENTATION     Patient:   Sabrina Rivers             MRN: [id number]            FIN: [id number]               Age:   39 years     Sex:  Female     DOB:  05/28/1991   Associated Diagnoses:   Pelvic pain   Author:   MARTIN,  TYLER CHARLES-PA      Basic Information   Additional information: Chief Complaint from Nursing Triage Note   Chief Complaint  Chief Complaint: pt c/o pelvic pain/pressure that has been going on for a while but worse today, she states when she wipes she feels like something is falling out. Denies pregnancy, she is in no distress (12/26/19 11:34:00).      History of Present Illness   Patient presents with complaints of pressure in her pelvic area which has been ongoing for the last couple of days.  She reports that she is had no bleeding, and her last menstrual period was 3 weeks ago.  She has had 2 prior C-sections 8 & 9 years ago.  She admits to some discharge, dysuria, lower back pain, and vomiting.  She denies any fever.        Review of Systems   Constitutional symptoms:  No fever, no chills, no sweats, no generalized weakness.    Skin symptoms:  No rash, no lesion.    Eye symptoms:  Vision unchanged, no pain, no discharge.    ENMT symptoms:  No ear pain, no sore  throat, no nasal congestion.    Respiratory symptoms:  No shortness of breath, no cough.    Cardiovascular symptoms:  No chest pain, no palpitations, no syncope.    Gastrointestinal symptoms:  Abdominal pain, pelvic, pressure, nausea, vomiting, No diarrhea,    Genitourinary symptoms:  Dysuria, vaginal discharge, No vaginal bleeding,    Musculoskeletal symptoms:  Back pain.   Neurologic symptoms:  No headache, no dizziness.       Health Status   Allergies:    Allergic Reactions (Selected)  Severe  Vancomycin- Redness..      Past Medical/ Family/ Social History   Problem list:    Active Problems (1)  No Chronic Problems   .      Physical Examination               Vital Signs   Vital Signs   12/26/2019 11:34 EDT Systolic Blood Pressure 149 mmHg  HI    Diastolic Blood Pressure 87 mmHg    Temperature Oral 36.7 degC    Heart Rate Monitored 78 bpm    Respiratory Rate 18 br/min    SpO2 100 %   .   Basic Oxygen Information   12/26/2019 11:34 EDT Oxygen Therapy Room air    SpO2 100 %   .   General:  Alert, no acute distress.    Skin:  Warm, dry.    Cardiovascular:  Regular rate and rhythm, No murmur, Normal peripheral perfusion.    Respiratory:  Lungs are clear to auscultation, respirations  are non-labored.    Gastrointestinal:  Soft, Nontender, Non distended.    Genitourinary:  Normal external genitalia, no lesions, Speculum exam: Moderate, discharge green, tenderness, no bleeding, no foreign body, not prolapse, Bimanual exam: Normal.    Back:  Nontender, Normal range of motion, No CVAT.    Musculoskeletal:  Normal ROM, normal strength, no tenderness, no swelling.    Psychiatric:  Cooperative.      Medical Decision Making   Differential Diagnosis:  Pelvic pain, urinary tract infection, pelvic inflammatory disease, dysmenorrhea, vaginal discharge, Uterine prolapse, cystocele, rectocele.    Tests pending:  Gonorrhea-Chlamydia.   Notes:  Patient with green vaginal discharge on exam.  Will treat empirically until results return.   No mass palpated on exam.  Labs within normal limits.  UA is clean.  Will have patient follow-up with OB for further diagnosis and treatment..      Impression and Plan   Diagnosis   Pelvic pain (ICD10-CM R10.2, Discharge, Medical)   Plan   Condition: Stable.    Disposition: Discharged.    Patient was given the following educational materials: Pelvic Pain, Female, Easy-to-Read, Pelvic Pain, Female, Easy-to-Read.    Follow up with: MONICA MITCHUM, Physician - OB/GYN Within 1 week, MONICA MITCHUM, Physician - OB/GYN Within 1 week, MONICA MITCHUM, Physician - OB/GYN Within 1 week Take over-the-counter ibuprofen and stool softeners.  Return to the ED if any worsening pain.    Counseled: Patient, Family, Regarding diagnosis, Regarding diagnostic results, Regarding treatment plan, Patient indicated understanding of instructions.

## 2019-12-26 NOTE — ED Notes (Signed)
ED Triage Note       ED Secondary Triage Entered On:  12/26/2019 14:17 EDT    Performed On:  12/26/2019 14:17 EDT by Nyra Jabs B-RN               General Information   Barriers to Learning :   None evident   ED Home Meds Section :   Document assessment   [facility] ED Fall Risk Section :   Document assessment   ED Advance Directives Section :   Document assessment   ED Palliative Screen :   N/A (prefilled for <39yo)   Thedora Hinders - 12/26/2019 14:17 EDT   (As Of: 12/26/2019 14:17:57 EDT)   Problems(Active)    No Chronic Problems (Cerner  :NKP )  Name of Problem:   No Chronic Problems ; Recorder:   Elly Modena; Code:   NKP ; Last Updated:   12/26/2019 11:38 EDT ; Life Cycle Date:   12/26/2019 ; Life Cycle Status:   Active ; Vocabulary:   Cerner          Diagnoses(Active)    Pelvic pain  Date:   12/26/2019 ; Diagnosis Type:   Reason For Visit ; Confirmation:   Complaint of ; Clinical Dx:   Pelvic pain ; Classification:   Medical ; Clinical Service:   Emergency medicine ; Code:   PNED ; Probability:   0 ; Diagnosis Code:   [phone]      Pelvic pain  Date:   12/26/2019 ; Diagnosis Type:   Discharge ; Confirmation:   Confirmed ; Clinical Dx:   Pelvic pain ; Classification:   Medical ; Clinical Service:   Non-Specified ; Code:   ICD-10-CM ; Probability:   0 ; Diagnosis Code:   R10.2      Vaginal discharge  Date:   12/26/2019 ; Diagnosis Type:   Discharge ; Confirmation:   Confirmed ; Clinical Dx:   Vaginal discharge ; Classification:   Medical ; Clinical Service:   Non-Specified ; Code:   ICD-10-CM ; Probability:   0 ; Diagnosis Code:   N89.8             -    Procedure History   (As Of: 12/26/2019 14:17:58 EDT)     Anesthesia Minutes:   0 ; Procedure Name:   C-section delivery ; Procedure Minutes:   0 ; Last Reviewed Dt/Tm:   12/26/2019 14:17:49 EDT            Anesthesia Minutes:   0 ; Procedure Name:   Hernia x3 ; Procedure Minutes:   0 ; Last Reviewed Dt/Tm:   12/26/2019 14:17:38 EDT             Lennette Bihari Fall Risk Assessment Tool   Hx of falling last 3 months ED Fall :   No   Patient confused or disoriented ED Fall :   No   Patient intoxicated or sedated ED Fall :   No   Patient impaired gait ED Fall :   No   Use a mobility assistance device ED Fall :   No   Patient altered elimination ED Fall :   No   [facility] ED Fall Score :   0    Nyra Jabs [facility] - 12/26/2019 14:17 EDT   ED Advance Directive   Advance Directive :   No   Thedora Hinders - 12/26/2019 14:17 EDT   Med Hx   Medication  List   (As Of: 12/26/2019 14:17:58 EDT)   Normal Order    cefTRIAXone  :   cefTRIAXone ; Status:   Completed ; Ordered As Mnemonic:   cefTRIAXone ( Rocephin ) ; Simple Display Line:   500 mg, IM, Once ; Ordering Provider:   Jasmine December; Catalog Code:   cefTRIAXone ; Order Dt/Tm:   12/26/2019 13:56:49 EDT          azithromycin 250 mg Tab  :   azithromycin 250 mg Tab ; Status:   Completed ; Ordered As Mnemonic:   Zithromax ; Simple Display Line:   1,000 mg, 4 tabs, Oral, Once ; Ordering Provider:   Jasmine December; Catalog Code:   azithromycin ; Order Dt/Tm:   12/26/2019 13:56:49 EDT

## 2019-12-26 NOTE — ED Notes (Signed)
ED Patient Education Note     Patient Education Materials Follows:  Obstetrics and Gynecology     Pelvic Pain, Female    Pelvic pain is pain felt below the belly button and between your hips. It can be caused by many different things. It is important to get help right away. This is especially true for severe, sharp, or unusual pain that comes on suddenly.       HOME CARE     Only take medicine as told by your doctor.     Rest as told by your doctor.     Eat a healthy diet, such as fruits, vegetables, and lean meats.      Drink enough fluids to keep your pee (urine) clear or pale yellow, or as told.     Avoid sex (intercourse) if it causes pain.     Apply warm or cold packs to your lower belly (abdomen). Use the type of pack that helps the pain.     Avoid situations that cause you stress.     Keep a journal to track your pain. Write down:     ? When the pain started.    ? Where it is located.    ? If there are things that seem to be related to the pain, such as food or your period.     Follow up with your doctor as told.    GET HELP RIGHT AWAY IF:     You have heavy bleeding from the vagina.     You have more pelvic pain.     You feel lightheaded or pass out (faint).     You have chills.     You have pain when you pee or have blood in your pee.     You cannot stop having watery poop (diarrhea).     You cannot stop throwing up (vomiting).     You have a fever or lasting symptoms for more than 3 days.     You have a fever and your symptoms suddenly get worse.     You are being physically or sexually abused.     Your medicine does not help your pain.     You have fluid (discharge) coming from your vagina that is not normal.    MAKE SURE YOU:     Understand these instructions.     Will watch your condition.     Will get help if you are not doing well or get worse.    This information is not intended to replace advice given to you by your health care provider. Make sure you discuss any questions you have with your health  care provider.    Document Released: 01/14/2008 Document Revised: 08/18/2014 Document Reviewed: 05/18/2015  Elsevier Interactive Patient Education ?2016 Elsevier Inc[employer]

## 2019-12-26 NOTE — ED Notes (Signed)
 ED Triage Note       ED Triage Adult Entered On:  12/26/2019 11:38 EDT    Performed On:  12/26/2019 11:34 EDT by Dasie Pagan H               Triage   Chief Complaint :   pt c/o pelvic pain/pressure that has been going on for a while but worse today, she states when she wipes she feels like something is falling out. Denies pregnancy, she is in no distress   Numeric Rating Pain Scale :   5 = Moderate pain   [place] Mode of Arrival :   Private vehicle   Infectious Disease Documentation :   Document assessment   Temperature Oral :   36.7 degC(Converted to: 98.1 degF)    Heart Rate Monitored :   78 bpm   Respiratory Rate :   18 br/min   Systolic Blood Pressure :   149 mmHg (HI)    Diastolic Blood Pressure :   87 mmHg   SpO2 :   100 %   Oxygen Therapy :   Room air   Patient presentation :   None of the above   Chief Complaint or Presentation suggest infection :   No   Weight Dosing :   92 kg(Converted to: 202 lb 13 oz)    Height :   160 cm(Converted to: 5 ft 3 in)    Body Mass Index Dosing :   36 kg/m2   Dasie Pagan DEL - 12/26/2019 11:34 EDT   DCP GENERIC CODE   Tracking Acuity :   4   Tracking Group :   ED [address] Tracking Group   Dasie Pagan DEL - 12/26/2019 11:34 EDT   ED General Section :   Document assessment   Pregnancy Status :   Patient denies   Last Menstrual Period :   12/02/2019 EDT   ED Allergies Section :   Document assessment   ED Reason for Visit Section :   Document assessment   Dasie Pagan DEL - 12/26/2019 11:34 EDT   ID Risk Screen Symptoms   Recent Travel History :   No recent travel   Close Contact with COVID-19 ID :   No   Last 14 days COVID-19 ID :   No   TB Symptom Screen :   No symptoms   C. diff Symptom/History ID :   Neither of the above   Dasie Pagan DEL - 12/26/2019 11:34 EDT   Allergies   (As Of: 12/26/2019 11:38:32 EDT)   Allergies (Active)   vancomycin  Estimated Onset Date:   Unspecified ; Reactions:   Redness ; Created By:   Dasie Pagan DEL; Reaction Status:   Active ; Category:   Drug ;  Substance:   vancomycin ; Type:   Allergy ; Severity:   Severe ; Updated By:   Dasie Pagan DEL; Reviewed Date:   12/26/2019 11:37 EDT        Psycho-Social   Last 3 mo, thoughts killing self/others :   Patient denies   Right click within box for Suspected Abuse policy link. :   None   Feels Safe Where Live :   Yes   Dasie Pagan DEL - 12/26/2019 11:34 EDT   ED Reason for Visit   (As Of: 12/26/2019 11:38:32 EDT)   Problems(Active)    No Chronic Problems (Cerner  :NKP )  Name of Problem:  No Chronic Problems ; Recorder:   Dasie Izetta DEL; Code:   NKP ; Last Updated:   12/26/2019 11:38 EDT ; Life Cycle Date:   12/26/2019 ; Life Cycle Status:   Active ; Vocabulary:   Cerner          Diagnoses(Active)    Pelvic pain  Date:   12/26/2019 ; Diagnosis Type:   Reason For Visit ; Confirmation:   Complaint of ; Clinical Dx:   Pelvic pain ; Classification:   Medical ; Clinical Service:   Emergency medicine ; Code:   PNED ; Probability:   0 ; Diagnosis Code:   [phone]

## 2019-12-28 LAB — C.TRACHOMATIS N.GONORRHOEAE DNA
Chlamydia trachomatis, NAA: NEGATIVE
Neisseria Gonorrhoeae, NAA: NEGATIVE

## 2020-05-01 LAB — HEMOGLOBIN A1C
Estimated Avg Glucose, External: 97 mg/dL (ref 77–114)
Hemoglobin A1C, External: 5 % (ref 4.3–5.6)

## 2023-04-21 ENCOUNTER — Inpatient Hospital Stay
Admit: 2023-04-21 | Discharge: 2023-04-22 | Disposition: A | Discharge status: Home / Self Care | Attending: Emergency Medicine

## 2023-04-21 ENCOUNTER — Emergency Department: Admit: 2023-04-22

## 2023-04-21 DIAGNOSIS — S0990XA Unspecified injury of head, initial encounter: Secondary | ICD-10-CM

## 2023-04-21 MED ORDER — KETOROLAC TROMETHAMINE 30 MG/ML IJ SOLN
30 | Freq: Once | INTRAMUSCULAR | Status: AC
Start: 2023-04-21 — End: 2023-04-21

## 2023-04-21 MED ADMIN — ketorolac (TORADOL) injection 30 mg: 30 mg | INTRAMUSCULAR | @ 23:00:00 | NDC 63323016200

## 2023-04-21 MED FILL — KETOROLAC TROMETHAMINE 30 MG/ML IJ SOLN: 30 MG/ML | INTRAMUSCULAR | Qty: 1
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
## 2023-06-11 LAB — HEMOGLOBIN A1C: Hemoglobin A1C, External: 5.5 % (ref 4.3–5.6)

## 2024-03-17 ENCOUNTER — Emergency Department: Admit: 2024-03-17 | Payer: BLUE CROSS/BLUE SHIELD

## 2024-03-17 ENCOUNTER — Inpatient Hospital Stay
Admit: 2024-03-17 | Discharge: 2024-03-17 | Disposition: A | Discharge status: Home / Self Care | Payer: BLUE CROSS/BLUE SHIELD | Arrived: VH | Attending: Emergency Medicine

## 2024-03-17 DIAGNOSIS — R0789 Other chest pain: Principal | ICD-10-CM

## 2024-03-17 LAB — COMPREHENSIVE METABOLIC PANEL W/ REFLEX TO MG FOR LOW K
ALT: 20 U/L (ref 0–42)
AST: 19 U/L (ref 0–46)
Albumin/Globulin Ratio: 1.3 (ref 1.00–2.70)
Albumin: 4 g/dL (ref 3.5–5.2)
Alk Phosphatase: 81 U/L (ref 35–117)
Anion Gap: 15 mmol/L (ref 2–17)
BUN: 12 mg/dL (ref 6–20)
CO2: 20 mmol/L — ABNORMAL LOW (ref 22–29)
Calcium: 9.2 mg/dL (ref 8.5–10.7)
Chloride: 107 mmol/L (ref 98–107)
Creatinine: 0.7 mg/dL (ref 0.5–1.0)
Est, Glom Filt Rate: 111 mL/min/1.73m (ref >=60)
Globulin: 3 g/dL (ref 1.9–4.4)
Glucose: 105 mg/dL — ABNORMAL HIGH (ref 70–99)
Osmolaliy Calculated: 283 mosm/kg (ref 270–287)
Potassium: 3.4 mmol/L — ABNORMAL LOW (ref 3.5–5.3)
Sodium: 142 mmol/L (ref 135–145)
Total Bilirubin: 0.17 mg/dL (ref 0.00–1.20)
Total Protein: 7.1 g/dL (ref 5.7–8.3)

## 2024-03-17 LAB — CBC WITH AUTO DIFFERENTIAL
Basophils %: 0.8 % (ref 0.0–2.0)
Basophils Absolute: 0.1 x10e3/mcL (ref 0.0–0.2)
Eosinophils %: 1.1 % (ref 0.0–7.0)
Eosinophils Absolute: 0.1 x10e3/mcL (ref 0.0–0.5)
Hematocrit: 38.5 % (ref 34.0–47.0)
Hemoglobin: 11.8 g/dL (ref 11.5–15.7)
Immature Grans (Abs): 0.01 x10e3/mcL (ref 0.00–0.06)
Immature Granulocytes %: 0.1 % (ref 0.0–0.6)
Lymphocytes Absolute: 3.2 x10e3/mcL (ref 1.0–3.2)
Lymphocytes: 37.9 % (ref 15.0–45.0)
MCH: 24.8 pg — ABNORMAL LOW (ref 27.0–34.5)
MCHC: 30.6 g/dL (ref 30.0–36.0)
MCV: 81.1 fL (ref 81.0–99.0)
MPV: 10.8 fL (ref 7.0–12.2)
Monocytes %: 7.7 % (ref 4.0–12.0)
Monocytes Absolute: 0.7 x10e3/mcL (ref 0.3–1.0)
Neutrophils %: 52.4 % (ref 42.0–74.0)
Neutrophils Absolute: 4.4 x10e3/mcL (ref 1.6–7.3)
Platelets: 253 x10e3/mcL (ref 140–440)
RBC: 4.75 x10e6/mcL (ref 3.60–5.20)
RDW: 19.4 % — ABNORMAL HIGH (ref 10.0–17.0)
WBC: 8.4 x10e3/mcL (ref 3.8–10.6)

## 2024-03-17 LAB — PROTIME-INR
INR: 0.9 — ABNORMAL LOW (ref 1.5–3.5)
Protime: 11.8 s (ref 11.6–14.5)

## 2024-03-17 LAB — TROPONIN
Troponin, High Sensitivity: <6 ng/L (ref 0–14)
Troponin, High Sensitivity: <6 ng/L (ref 0–14)

## 2024-03-17 LAB — MAGNESIUM: Magnesium: 2.1 mg/dL (ref 1.6–2.6)

## 2024-03-17 LAB — LIPASE: Lipase: 32 U/L (ref 13–60)

## 2024-03-17 MED ORDER — CYCLOBENZAPRINE HCL 10 MG PO TABS
10 | ORAL_TABLET | Freq: Three times a day (TID) | ORAL | 0 refills | 10.00000 days | Status: AC | PRN
Start: 2024-03-17 — End: 2024-03-22

## 2024-03-17 MED ORDER — LORAZEPAM 2 MG/ML IJ SOLN
2 | Freq: Once | INTRAMUSCULAR | Status: AC
Start: 2024-03-17 — End: 2024-03-17
  Administered 2024-03-17: 05:00:00 1 mg via INTRAVENOUS

## 2024-03-17 MED FILL — LORAZEPAM 2 MG/ML IJ SOLN: 2 mg/mL | INTRAMUSCULAR | Qty: 1 | Fill #0

## 2024-03-17 NOTE — ED Provider Notes (Signed)
 [facility]  EMERGENCY DEPARTMENT ENCOUNTER      Pt Name: Sabrina Rivers  MRN: [id number]  Birthdate 10-30-1980  Date of evaluation: 03/17/2024  Provider: Debby Juliene Santee, MD    CHIEF COMPLAINT       Chief Complaint   Patient presents with    Chest Pain      Pt has been having chest pain off and on for months.  Is being followed by cardiology and has had holter monitor. Due to discuss results with cards. States pain is back tonight         HISTORY OF PRESENT ILLNESS    HPI    43 y.o. female here with chest pain.  Patient has had intermittent chest discomfort over the past several months.  She has been referred to cardiology by her primary care provider.  They had a Holter monitor placed and performed a stress test which was apparently negative.  She has been additional testing scheduled, however patient is unsure regarding the specifics.  She reports about an hour prior to arrival she has had some worsening of substernal pressure-like chest discomfort.  No change with exertion or deep breath.  This is similar in quality to pain she has been having for the past few months.  No associated fever/chills, URI symptoms, coughing, nausea/vomiting, diaphoresis, palpitations, lightheadedness, syncope, orthopnea, lower extremity swelling, recent surgery/immobilization, history of PE/DVT.    Nursing Notes were reviewed.    REVIEW OF SYSTEMS       Review of Systems    Except as noted above the remainder of the review of systems was reviewed and negative.       PAST MEDICAL HISTORY   No past medical history on file.    SURGICAL HISTORY     No past surgical history on file.    CURRENT MEDICATIONS       Previous Medications    No medications on file       ALLERGIES     Vancomycin    FAMILY HISTORY     No family history on file.     SOCIAL HISTORY       Social History     Socioeconomic History    Marital status: Married           PHYSICAL EXAM    (up to 7 for level 4, 8 or more for level 5)     ED Triage Vitals   BP  Systolic BP Percentile Diastolic BP Percentile Temp Temp src Pulse Resp SpO2   -- -- -- -- -- -- -- --      Height Weight         -- --             Physical Exam  Constitutional:       General: She is not in acute distress.     Appearance: She is not toxic-appearing.   HENT:      Head: Normocephalic and atraumatic.      Mouth/Throat:      Mouth: Mucous membranes are moist.   Eyes:      Extraocular Movements: Extraocular movements intact.      Conjunctiva/sclera: Conjunctivae normal.      Pupils: Pupils are equal, round, and reactive to light.   Cardiovascular:      Rate and Rhythm: Normal rate and regular rhythm.      Pulses: Normal pulses.   Pulmonary:      Effort: Pulmonary effort is normal.  Breath sounds: Normal breath sounds.   Abdominal:      General: There is no distension.      Palpations: Abdomen is soft.      Tenderness: There is no guarding or rebound.   Musculoskeletal:      Cervical back: Normal range of motion and neck supple.      Right lower leg: No edema.      Left lower leg: No edema.   Skin:     General: Skin is warm and dry.      Capillary Refill: Capillary refill takes less than 2 seconds.      Findings: No rash.   Neurological:      General: No focal deficit present.      Mental Status: She is alert and oriented to person, place, and time.         DIAGNOSTIC RESULTS   PROCEDURES:  Unless otherwise noted below, none     Procedures    EKG: All EKG's are interpreted by the Emergency Department Physician who either signs or Co-signs this chart in the absence of a cardiologist.      RADIOLOGY:   Non-plain film images such as CT, Ultrasound and MRI are read by the radiologist. Plain radiographic images are visualized and preliminarily interpreted by the emergency physician with the below findings. All radiology studies are overread and interpreted by a radiologist:    Interpretation per the Radiologist below, if available at the time of this note:    XR CHEST PORTABLE    (Results Pending)        ED BEDSIDE ULTRASOUND:   Performed by ED Physician - none    LABS:  Labs Reviewed   CBC WITH AUTO DIFFERENTIAL - Abnormal; Notable for the following components:       Result Value    MCH 24.8 (*)     RDW 19.4 (*)     All other components within normal limits   COMPREHENSIVE METABOLIC PANEL W/ REFLEX TO MG FOR LOW K - Abnormal; Notable for the following components:    Potassium 3.4 (*)     CO2 20 (*)     Glucose 105 (*)     All other components within normal limits   PROTIME-INR - Abnormal; Notable for the following components:    INR 0.9 (*)     All other components within normal limits   LIPASE   TROPONIN   TROPONIN   MAGNESIUM       All other labs were within normal range or not returned as of this dictation.    EMERGENCY DEPARTMENT COURSE/REASSESSMENT and MDM:   Vitals:    Vitals:    03/17/24 0053 03/17/24 0123 03/17/24 0132 03/17/24 0202   BP: 124/75 (!) 117/59 121/73 129/74   Pulse:  82 76 73   Resp:  10 13 13    Temp:       TempSrc:       SpO2:  96% 95% 96%   Weight:       Height:           ED Course:       MDM      43 y.o. female here with worsening of chest pain which has been intermittent over the past   several months.  Had a negative stress test and evaluation of this.  She is well-appearing on exam, vitals are stable.  EKG with no acute ischemic changes.  Lab work is unremarkable.  Troponins are negative  x 2.  Lower suspicion for ischemia given symptoms ongoing with recent negative stress test.  Do not feel admission for ischemic evaluation needed at this time.  She is low risk for PE, PE RC negative.  Feels better on reassessment.  Feel she is stable for outpatient follow-up for further evaluation.  Supportive care discussed.  Advised to follow up with primary care physician in 2-3 days for repeat evaluation. ED return precautions were discussed in detail. All questions were answered. Patient/caregiver verbalized understanding of discharge plan and return precautions and is amenable to  discharge.    CONSULTS:  None    FINAL IMPRESSION      1. Chest pain, unspecified type          DISPOSITION/PLAN   DISPOSITION Decision To Discharge 03/17/2024 02:28:54 AM   DISPOSITION CONDITION Stable           PATIENT REFERRED TO:  PCP  Follow-up with your primary care provider in the next 2-3 days. Return to the ER immediately if you develop any new/worsening concerning symptoms. If you do not have a PCP, we can provide info to help arrange this.          DISCHARGE MEDICATIONS:  New Prescriptions    CYCLOBENZAPRINE  (FLEXERIL ) 10 MG TABLET    Take 1 tablet by mouth 3 times daily as needed for Muscle spasms       Controlled Substances Monitoring:          No data to display                (Please note that portions of this note were completed with a voice recognition program.  Efforts were made to edit the dictations but occasionally words are mis-transcribed.)    Debby Juliene Santee, MD (electronically signed)  Attending Emergency Physician           Santee Debby Juliene, MD  03/17/24 [phone]

## 2024-03-18 LAB — EKG 12-LEAD
P Axis: 66 degrees
P-R Interval: 142 ms
Q-T Interval: 370 ms
QRS Duration: 78 ms
QTc Calculation (Bazett): 421 ms
R Axis: 63 degrees
T Axis: 18 degrees
Ventricular Rate: 93 {beats}/min

## 2024-04-18 ENCOUNTER — Ambulatory Visit: Admit: 2024-04-18 | Discharge: 2024-04-18 | Payer: BLUE CROSS/BLUE SHIELD | Attending: Surgery

## 2024-04-18 NOTE — Progress Notes (Signed)
 Chief Complaint   Patient presents with    New Patient     Incisional Hernia       PCP: No primary care provider on file.    04/19/2023  History of Present Illness  The patient presents with abdominal pain.    First umbilical hernia repair in 2014.  It sounds like it was a mesh based repair.  She then reports that she was traveling abroad in [place] and required emergency abdominal surgery.  This sounds like an acute abdominal hernia (possibly strangulated?) that was repaired with primarily with sutures.  She was told at the time she may have an infection and needed mesh removal.  She ultimately was  treated with vancomycin, causing adverse reactions and mesh was not removed. Another hernia diagnosed upon return to the [place] , leading to third repair in 2017 with large mesh placement. Persistent pain despite interventions.    Symptoms worsened this year: weekly flu-like symptoms, shortness of breath, fever, abdominal bloating. ER visit showed elevated WBC count, suggesting inflammation or infection. CT scan normal except for possible cellulitis. Severe bloating after eating, difficulty walking due to pain. Not considering further surgery. On gluten-free diet for over a month, tried Linzess, MiraLAX, IBSrela without success.    Diagnosed with IBS and chronic constipation, takes OTC laxatives. 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    Past Medical History:   Diagnosis Date    Uterine polyp        Past Surgical History:   Procedure Laterality Date    CESAREAN SECTION      x2    INGUINAL HERNIA REPAIR           Current Outpatient Medications:     meloxicam (MOBIC) 7.5 MG tablet, Take 1 tablet by mouth daily, Disp: , Rfl:     cyclobenzaprine (FLEXERIL) 5 MG tablet, Take 1 tablet by mouth nightly as needed, Disp: , Rfl:     loratadine (CLARITIN) 10 MG tablet, Take 1 tablet by mouth daily, Disp: , Rfl:      Allergies   Allergen Reactions    Vancomycin Shortness Of Breath       Social History      Socioeconomic History    Marital status: Married   Tobacco Use    Smoking status: Never     Social Drivers of Psychologist, prison and probation services Strain: Low Risk  (11/25/2023)    Received from [facility]     Overall Financial Resource Strain (CARDIA)     Difficulty of Paying Living Expenses: Not hard at all   Food Insecurity: No Food Insecurity (11/25/2023)    Received from [facility]     Hunger Vital Sign     Within the past 12 months, you worried that your food would run out before you got the money to buy more.: Never true     Within the past 12 months, the food you bought just didn't last and you didn't have money to get more.: Never true   Transportation Needs: No Transportation Needs (11/25/2023)    Received from [facility]     [employer] - Transportation     Lack of Transportation (Medical): No     Lack of Transportation (Non-Medical): No   Physical Activity: Sufficiently Active (11/25/2023)    Received from [facility]     Exercise Vital Sign     On average, how many days per week do you engage in  moderate to strenuous exercise (like a brisk walk)?: 7 days     On average, how many minutes do you engage in exercise at this level?: 60 min   Stress: Stress Concern Present (11/25/2023)    Received from [facility]     [employer] of Occupational [employer] - Occupational Stress Questionnaire     Feeling of Stress : To some extent   Social Connections: Socially Integrated (11/25/2023)    Received from [facility]     Social Connection and Isolation Panel     In a typical week, how many times do you talk on the phone with family, friends, or neighbors?: More than three times a week     How often do you get together with friends or relatives?: Once a week     How often do you attend church or religious services?: 1 to 4 times per year     Do you belong to any clubs or  organizations such as church groups, unions, fraternal or athletic groups, or school groups?: Yes     How often do you attend meetings of the clubs or organizations you belong to?: More than 4 times per year     Are you married, widowed, divorced, separated, never married, or living with a partner?: Married   Intimate Partner Violence: Not At Risk (02/29/2024)    Received from [facility]     Abuse Screen     Feels Unsafe at Home or Work/School: no     Feels Threatened by Someone: no     Does Anyone Try to Keep You From Having Contact with Others or Doing Things Outside Your Home?: no     Physical Signs of Abuse Present: no   Housing Stability: Unknown (11/25/2023)    Received from [facility]     Housing Stability Vital Sign     In the last 12 months, was there a time when you were not able to pay the mortgage or rent on time?: No     At any time in the past 12 months, were you homeless or living in a shelter (including now)?: No       No family history on file.    Review of Systems   Constitutional:  Negative for activity change, fever and unexpected weight change.   HENT:  Negative for sore throat and voice change.    Respiratory:  Negative for shortness of breath.    Cardiovascular:  Negative for chest pain and leg swelling.   Gastrointestinal:  Positive for abdominal distention, abdominal pain and constipation. Negative for diarrhea and nausea.   Genitourinary:  Negative for difficulty urinating.   Musculoskeletal:  Negative for gait problem.   Skin:  Negative for rash.   Neurological:  Negative for seizures, syncope and weakness.   Hematological:  Does not bruise/bleed easily.       Objective:  There were no vitals filed for this visit.       Physical Exam  Constitutional:       Appearance: Normal appearance.   HENT:      Head: Normocephalic and atraumatic.   Eyes:      General: No scleral icterus.  Cardiovascular:      Rate and Rhythm: Normal rate and regular  rhythm.      Heart sounds: Normal heart sounds.   Pulmonary:      Effort: Pulmonary effort is normal.      Breath  sounds: Normal breath sounds.   Abdominal:      General: Abdomen is flat. Bowel sounds are normal. There is no distension.      Palpations: Abdomen is soft. There is no mass.      Tenderness: There is abdominal tenderness. There is no guarding or rebound.      Hernia: No hernia is present.      Comments: Abdominal laxity.  Well-healed transverse scar just above the umbilicus.  Well-healed laparoscopic scars.  No appreciable hernia.   Musculoskeletal:         General: Normal range of motion.      Cervical back: Neck supple.   Lymphadenopathy:      Cervical: No cervical adenopathy.   Skin:     General: Skin is warm and dry.      Coloration: Skin is not jaundiced.   Neurological:      Mental Status: She is alert and oriented to person, place, and time. Mental status is at baseline.   Psychiatric:         Mood and Affect: Mood normal.         Behavior: Behavior normal.         Assessment & Plan  1. Abdominal pain: Chronic.   - Initial hernia repair with smaller mesh likely failed due to elastic abdominal wall secondary to her Ehlers-Danlos   - Subsequent surgery in [place] involved primary repair with mesh left in place.  - 2017 repair with large mesh prosthesis  - Recent CT report reviewed and showed thickening of umbilical fold, possibly scar tissue or postoperative changes but no recurrent hernia.  - I reviewed her most recent operative report and surgical records.    - I think it is unlikely that her abdominal pain and bloating are related to her mesh.  I think the initial mesh had been well incorporated and only partially excised.  It is unlikely that it would harbor  infection and be the cause of the particular symptoms this far out without any other convincing evidence of infection on imaging or physical exam.  No known etiology of her recent elevated WBC.  I suspect it is unrelated.  It has  subsequently resolved with a normal CBC.  -Her main symptom seems to be bloating and abdominal pain secondary to the bloating.   - It is conceivable that she is feeling some stretch and abdominal tightness from possible contracted mesh.  It certainly would not be the source of the bloating which seems to be a bigger problem.  -Excision of the mass would require an extensive operation and lead to recurrent ventral/incisional hernias.  Further hernia repairs would be more complicated and high risk for subsequent recurrences.  IBS-C and Ehlers-Danlos complicate symptom differentiation but I think are playing a larger role in her abdominal complaints than the mesh.    Probiotic  - Hydration  - Fiber supplementation  - [place] FODMAP  - Consult GI doctor or seek second opinion    Follow-up  - Consult GI doctor or seek second opinion     I spent 45 minutes in chart review, face-to-face encounter, and creation of documentation.    Charlie DELENA [place] DOUGLAS, MD
